# Patient Record
Sex: Female | Born: 1950 | Race: White | Hispanic: Yes | Smoking: Former smoker
Health system: Southern US, Community
[De-identification: ages and names within clinical notes are randomized; demographics above are authoritative.]

## PROBLEM LIST (undated history)

## (undated) DIAGNOSIS — K635 Polyp of colon: Secondary | ICD-10-CM

## (undated) DIAGNOSIS — L900 Lichen sclerosus et atrophicus: Secondary | ICD-10-CM

## (undated) DIAGNOSIS — E039 Hypothyroidism, unspecified: Secondary | ICD-10-CM

## (undated) DIAGNOSIS — J45909 Unspecified asthma, uncomplicated: Secondary | ICD-10-CM

## (undated) DIAGNOSIS — Z780 Asymptomatic menopausal state: Secondary | ICD-10-CM

## (undated) DIAGNOSIS — I2510 Atherosclerotic heart disease of native coronary artery without angina pectoris: Secondary | ICD-10-CM

## (undated) DIAGNOSIS — I10 Essential (primary) hypertension: Secondary | ICD-10-CM

## (undated) DIAGNOSIS — R0600 Dyspnea, unspecified: Secondary | ICD-10-CM

## (undated) DIAGNOSIS — D649 Anemia, unspecified: Secondary | ICD-10-CM

## (undated) HISTORY — DX: Lichen sclerosus et atrophicus: L90.0

## (undated) HISTORY — DX: Unspecified asthma, uncomplicated: J45.909

## (undated) HISTORY — DX: Anemia, unspecified: D64.9

## (undated) HISTORY — PX: SHOULDER SURGERY: SHX246

## (undated) HISTORY — PX: CARPAL TUNNEL RELEASE: SHX101

## (undated) HISTORY — DX: Polyp of colon: K63.5

## (undated) HISTORY — DX: Essential (primary) hypertension: I10

## (undated) HISTORY — DX: Hypothyroidism, unspecified: E03.9

## (undated) HISTORY — DX: Asymptomatic menopausal state: Z78.0

---

## 2001-05-05 ENCOUNTER — Other Ambulatory Visit: Admission: RE | Admit: 2001-05-05 | Discharge: 2001-05-05 | Payer: Self-pay | Admitting: Gynecology

## 2001-10-25 ENCOUNTER — Encounter: Admission: RE | Admit: 2001-10-25 | Discharge: 2001-11-23 | Payer: Self-pay | Admitting: *Deleted

## 2001-11-15 HISTORY — PX: BREAST SURGERY: SHX581

## 2002-05-10 ENCOUNTER — Other Ambulatory Visit: Admission: RE | Admit: 2002-05-10 | Discharge: 2002-05-10 | Payer: Self-pay | Admitting: Gynecology

## 2002-09-20 ENCOUNTER — Encounter: Admission: RE | Admit: 2002-09-20 | Discharge: 2002-09-20 | Payer: Self-pay | Admitting: Otolaryngology

## 2002-09-20 ENCOUNTER — Encounter: Payer: Self-pay | Admitting: Otolaryngology

## 2002-10-16 ENCOUNTER — Ambulatory Visit (HOSPITAL_BASED_OUTPATIENT_CLINIC_OR_DEPARTMENT_OTHER): Admission: RE | Admit: 2002-10-16 | Discharge: 2002-10-16 | Payer: Self-pay | Admitting: Otolaryngology

## 2002-10-16 ENCOUNTER — Encounter (INDEPENDENT_AMBULATORY_CARE_PROVIDER_SITE_OTHER): Payer: Self-pay | Admitting: *Deleted

## 2003-01-21 ENCOUNTER — Ambulatory Visit (HOSPITAL_BASED_OUTPATIENT_CLINIC_OR_DEPARTMENT_OTHER): Admission: RE | Admit: 2003-01-21 | Discharge: 2003-01-21 | Payer: Self-pay | Admitting: Otolaryngology

## 2003-04-08 ENCOUNTER — Encounter (INDEPENDENT_AMBULATORY_CARE_PROVIDER_SITE_OTHER): Payer: Self-pay | Admitting: *Deleted

## 2003-04-08 ENCOUNTER — Ambulatory Visit (HOSPITAL_COMMUNITY): Admission: RE | Admit: 2003-04-08 | Discharge: 2003-04-08 | Payer: Self-pay | Admitting: Gastroenterology

## 2003-05-13 ENCOUNTER — Ambulatory Visit (HOSPITAL_COMMUNITY): Admission: RE | Admit: 2003-05-13 | Discharge: 2003-05-13 | Payer: Self-pay | Admitting: Gastroenterology

## 2003-05-13 ENCOUNTER — Encounter (INDEPENDENT_AMBULATORY_CARE_PROVIDER_SITE_OTHER): Payer: Self-pay | Admitting: *Deleted

## 2004-04-17 ENCOUNTER — Encounter: Admission: RE | Admit: 2004-04-17 | Discharge: 2004-04-17 | Payer: Self-pay | Admitting: Family Medicine

## 2004-09-23 ENCOUNTER — Encounter: Admission: RE | Admit: 2004-09-23 | Discharge: 2004-09-23 | Payer: Self-pay | Admitting: Family Medicine

## 2004-11-27 ENCOUNTER — Encounter: Admission: RE | Admit: 2004-11-27 | Discharge: 2004-11-27 | Payer: Self-pay | Admitting: Family Medicine

## 2004-12-24 ENCOUNTER — Other Ambulatory Visit: Admission: RE | Admit: 2004-12-24 | Discharge: 2004-12-24 | Payer: Self-pay | Admitting: Gynecology

## 2005-03-15 ENCOUNTER — Encounter: Admission: RE | Admit: 2005-03-15 | Discharge: 2005-03-15 | Payer: Self-pay | Admitting: Family Medicine

## 2006-01-10 ENCOUNTER — Other Ambulatory Visit: Admission: RE | Admit: 2006-01-10 | Discharge: 2006-01-10 | Payer: Self-pay | Admitting: Gynecology

## 2006-04-15 DIAGNOSIS — K635 Polyp of colon: Secondary | ICD-10-CM

## 2006-04-15 HISTORY — DX: Polyp of colon: K63.5

## 2006-10-20 ENCOUNTER — Encounter: Admission: RE | Admit: 2006-10-20 | Discharge: 2006-10-20 | Payer: Self-pay | Admitting: Family Medicine

## 2006-10-20 ENCOUNTER — Ambulatory Visit: Payer: Self-pay | Admitting: Family Medicine

## 2006-12-01 ENCOUNTER — Encounter: Admission: RE | Admit: 2006-12-01 | Discharge: 2006-12-01 | Payer: Self-pay | Admitting: Family Medicine

## 2006-12-15 ENCOUNTER — Ambulatory Visit: Payer: Self-pay | Admitting: Family Medicine

## 2007-02-01 ENCOUNTER — Other Ambulatory Visit: Admission: RE | Admit: 2007-02-01 | Discharge: 2007-02-01 | Payer: Self-pay | Admitting: Gynecology

## 2007-06-27 ENCOUNTER — Encounter: Admission: RE | Admit: 2007-06-27 | Discharge: 2007-06-27 | Payer: Self-pay | Admitting: Gynecology

## 2007-08-30 ENCOUNTER — Encounter: Payer: Self-pay | Admitting: Family Medicine

## 2007-08-30 LAB — CONVERTED CEMR LAB: Pap Smear: NORMAL

## 2007-11-24 ENCOUNTER — Telehealth (INDEPENDENT_AMBULATORY_CARE_PROVIDER_SITE_OTHER): Payer: Self-pay | Admitting: *Deleted

## 2007-11-24 ENCOUNTER — Encounter: Admission: RE | Admit: 2007-11-24 | Discharge: 2007-11-24 | Payer: Self-pay | Admitting: Family Medicine

## 2007-11-24 ENCOUNTER — Ambulatory Visit: Payer: Self-pay | Admitting: Family Medicine

## 2007-11-24 DIAGNOSIS — N951 Menopausal and female climacteric states: Secondary | ICD-10-CM

## 2007-11-24 DIAGNOSIS — M810 Age-related osteoporosis without current pathological fracture: Secondary | ICD-10-CM | POA: Insufficient documentation

## 2007-11-24 DIAGNOSIS — J45909 Unspecified asthma, uncomplicated: Secondary | ICD-10-CM | POA: Insufficient documentation

## 2007-11-24 DIAGNOSIS — E039 Hypothyroidism, unspecified: Secondary | ICD-10-CM | POA: Insufficient documentation

## 2007-11-27 ENCOUNTER — Telehealth (INDEPENDENT_AMBULATORY_CARE_PROVIDER_SITE_OTHER): Payer: Self-pay | Admitting: *Deleted

## 2007-11-30 ENCOUNTER — Ambulatory Visit: Payer: Self-pay | Admitting: Family Medicine

## 2007-12-01 ENCOUNTER — Ambulatory Visit: Payer: Self-pay

## 2007-12-01 ENCOUNTER — Encounter (INDEPENDENT_AMBULATORY_CARE_PROVIDER_SITE_OTHER): Payer: Self-pay | Admitting: Family Medicine

## 2007-12-01 ENCOUNTER — Ambulatory Visit: Payer: Self-pay | Admitting: Cardiology

## 2007-12-04 ENCOUNTER — Telehealth (INDEPENDENT_AMBULATORY_CARE_PROVIDER_SITE_OTHER): Payer: Self-pay | Admitting: *Deleted

## 2007-12-06 ENCOUNTER — Ambulatory Visit: Payer: Self-pay | Admitting: Family Medicine

## 2007-12-06 ENCOUNTER — Encounter (INDEPENDENT_AMBULATORY_CARE_PROVIDER_SITE_OTHER): Payer: Self-pay | Admitting: Family Medicine

## 2007-12-06 DIAGNOSIS — I253 Aneurysm of heart: Secondary | ICD-10-CM | POA: Insufficient documentation

## 2007-12-07 ENCOUNTER — Telehealth (INDEPENDENT_AMBULATORY_CARE_PROVIDER_SITE_OTHER): Payer: Self-pay | Admitting: Family Medicine

## 2007-12-15 ENCOUNTER — Encounter: Payer: Self-pay | Admitting: Internal Medicine

## 2007-12-15 ENCOUNTER — Ambulatory Visit: Payer: Self-pay | Admitting: Thoracic Surgery (Cardiothoracic Vascular Surgery)

## 2007-12-15 ENCOUNTER — Encounter (INDEPENDENT_AMBULATORY_CARE_PROVIDER_SITE_OTHER): Payer: Self-pay | Admitting: Family Medicine

## 2007-12-19 ENCOUNTER — Ambulatory Visit: Payer: Self-pay | Admitting: Cardiology

## 2007-12-19 ENCOUNTER — Encounter: Payer: Self-pay | Admitting: Physician Assistant

## 2007-12-27 ENCOUNTER — Ambulatory Visit: Payer: Self-pay

## 2007-12-27 ENCOUNTER — Encounter (INDEPENDENT_AMBULATORY_CARE_PROVIDER_SITE_OTHER): Payer: Self-pay | Admitting: Family Medicine

## 2008-01-08 ENCOUNTER — Encounter: Admission: RE | Admit: 2008-01-08 | Discharge: 2008-01-08 | Payer: Self-pay | Admitting: Family Medicine

## 2008-01-11 ENCOUNTER — Ambulatory Visit: Payer: Self-pay | Admitting: Cardiovascular Disease

## 2008-01-24 ENCOUNTER — Encounter (INDEPENDENT_AMBULATORY_CARE_PROVIDER_SITE_OTHER): Payer: Self-pay | Admitting: Family Medicine

## 2008-01-29 ENCOUNTER — Ambulatory Visit: Payer: Self-pay | Admitting: Family Medicine

## 2008-04-12 ENCOUNTER — Telehealth (INDEPENDENT_AMBULATORY_CARE_PROVIDER_SITE_OTHER): Payer: Self-pay | Admitting: *Deleted

## 2008-05-20 ENCOUNTER — Ambulatory Visit: Payer: Self-pay | Admitting: Internal Medicine

## 2008-05-20 DIAGNOSIS — I2510 Atherosclerotic heart disease of native coronary artery without angina pectoris: Secondary | ICD-10-CM | POA: Insufficient documentation

## 2008-05-22 ENCOUNTER — Telehealth: Payer: Self-pay | Admitting: Internal Medicine

## 2008-08-19 ENCOUNTER — Encounter: Payer: Self-pay | Admitting: Family Medicine

## 2008-08-19 ENCOUNTER — Ambulatory Visit: Payer: Self-pay | Admitting: Family Medicine

## 2008-08-19 DIAGNOSIS — K3189 Other diseases of stomach and duodenum: Secondary | ICD-10-CM

## 2008-08-19 DIAGNOSIS — R1013 Epigastric pain: Secondary | ICD-10-CM

## 2008-08-19 LAB — CONVERTED CEMR LAB
Bilirubin Urine: NEGATIVE
Glucose, Urine, Semiquant: NEGATIVE
Ketones, urine, test strip: NEGATIVE
Specific Gravity, Urine: <1.005
Urobilinogen, UA: NEGATIVE
pH: 6.5

## 2008-08-29 ENCOUNTER — Encounter (INDEPENDENT_AMBULATORY_CARE_PROVIDER_SITE_OTHER): Payer: Self-pay | Admitting: *Deleted

## 2008-08-30 ENCOUNTER — Ambulatory Visit: Payer: Self-pay | Admitting: Family Medicine

## 2008-08-30 DIAGNOSIS — K921 Melena: Secondary | ICD-10-CM | POA: Insufficient documentation

## 2008-08-30 LAB — CONVERTED CEMR LAB: OCCULT 1: POSITIVE

## 2008-09-27 ENCOUNTER — Telehealth (INDEPENDENT_AMBULATORY_CARE_PROVIDER_SITE_OTHER): Payer: Self-pay | Admitting: *Deleted

## 2008-10-07 ENCOUNTER — Ambulatory Visit: Payer: Self-pay | Admitting: Family Medicine

## 2008-10-07 DIAGNOSIS — I10 Essential (primary) hypertension: Secondary | ICD-10-CM | POA: Insufficient documentation

## 2008-11-22 ENCOUNTER — Ambulatory Visit: Payer: Self-pay | Admitting: Family Medicine

## 2008-11-25 ENCOUNTER — Ambulatory Visit: Payer: Self-pay | Admitting: Family Medicine

## 2008-11-26 ENCOUNTER — Encounter: Payer: Self-pay | Admitting: Family Medicine

## 2008-12-09 ENCOUNTER — Encounter (INDEPENDENT_AMBULATORY_CARE_PROVIDER_SITE_OTHER): Payer: Self-pay | Admitting: *Deleted

## 2009-01-28 ENCOUNTER — Ambulatory Visit: Payer: Self-pay | Admitting: Gynecology

## 2009-01-28 ENCOUNTER — Other Ambulatory Visit: Admission: RE | Admit: 2009-01-28 | Discharge: 2009-01-28 | Payer: Self-pay | Admitting: Gynecology

## 2009-01-28 ENCOUNTER — Encounter: Payer: Self-pay | Admitting: Gynecology

## 2009-02-13 ENCOUNTER — Encounter: Admission: RE | Admit: 2009-02-13 | Discharge: 2009-02-13 | Payer: Self-pay | Admitting: Gynecology

## 2009-02-17 ENCOUNTER — Ambulatory Visit: Payer: Self-pay | Admitting: Gynecology

## 2009-04-08 ENCOUNTER — Ambulatory Visit: Payer: Self-pay | Admitting: Gynecology

## 2009-12-05 ENCOUNTER — Emergency Department (HOSPITAL_BASED_OUTPATIENT_CLINIC_OR_DEPARTMENT_OTHER): Admission: EM | Admit: 2009-12-05 | Discharge: 2009-12-05 | Payer: Self-pay | Admitting: Emergency Medicine

## 2009-12-05 ENCOUNTER — Ambulatory Visit: Payer: Self-pay | Admitting: Diagnostic Radiology

## 2009-12-09 ENCOUNTER — Telehealth: Payer: Self-pay | Admitting: Family Medicine

## 2010-01-29 ENCOUNTER — Ambulatory Visit: Payer: Self-pay | Admitting: Gynecology

## 2010-03-15 HISTORY — PX: COLONOSCOPY W/ BIOPSIES: SHX1374

## 2010-12-15 NOTE — Progress Notes (Signed)
Summary: refill  Phone Note Refill Request Message from:  Fax from Pharmacy on [address] fax [phone]  levothyroxine  Initial call taken by: Barb Merino,  December 09, 2009 3:57 PM  Follow-up for Phone Call        Pt has not been in since 11/2008. Army Fossa CMA  December 09, 2009 4:12 PM   Additional Follow-up for Phone Call Additional follow up Details #1::        pt needs ov---fill until appoint Additional Follow-up by: Loreen Freud DO,  December 09, 2009 5:06 PM    Additional Follow-up for Phone Call Additional follow up Details #2::    Pt states she is not longer going here- she is seeing a dr who speaks spanish. unsure of name. Army Fossa CMA  December 10, 2009 8:31 AM

## 2011-03-30 NOTE — Assessment & Plan Note (Signed)
[facility]                            CARDIOLOGY OFFICE NOTE   Felicia [name], Felicia [name]                        MRN:          [id number]  DATE:12/19/2007                            DOB:          [birth year]    REFERRING PHYSICIAN:  Salvatore Decent. Dorris Fetch, M.D.   CHIEF COMPLAINT:  Left arm pain.   HISTORY OF PRESENT ILLNESS:  Felicia [name] is a 60 year old with his who  has been recently referred by Dr. Dorris Fetch for suspected pulmonary  nodule on chest x-ray.  A CT scan was done.  There were no masses or  nodules noted.  Felicia coronary calcification was noted.  Felicia [name] had an  echocardiogram which demonstrated preserved left ventricular function  but an atrial septal aneurysm.  In careful questioning, Felicia [name] has some  discomfort that extends from Felicia [name] elbow down into Felicia [name] hand.  It is  intermittent but not clearly related to exertion or activity.  Felicia [name]  denies specific chest pain or other major symptoms.  Felicia [name] has had some  shortness of breath, and at times has had wheezing for which Felicia [name] took an  inhaler previously.  Nonetheless, Felicia [name] has not had problems in Felicia  interim.  CT scan demonstrated coronary calcification and as such, given  Felicia [name] symptoms, Felicia [name] was referred for further evaluation by Dr.  Dorris Fetch.  One additional note was Felicia finding of an atrial septal  aneurysm by echocardiography.  Felicia echocardiogram was read by Dr. Olga Millers.  Ejection fraction is normal with an ejection fraction of 55-  65% with no wall motion abnormalities.  Felicia aortic leaflets were  trileaflet with normal aortic valve structure and function.  Mitral  valve was intact.  Felicia left atrium was normal.  There was an atrial  septal aneurysm.  Pulmonic valve and tricuspid valve were normal as  well.  There is no pericardial infusion.  Felicia inferior vena cava was  normal and right atrium was normal.   There are no specific alleviating factors.  There is no obvious specific  radiation.   PAST MEDICAL HISTORY:  Felicia [name] has had tonsillectomy, C-section 21  years ago, a nose procedure 6 years ago, and right breast biopsy 10  years ago.  Felicia [name] has had asthma at times.  Felicia [name] has had occasional  constipation and Felicia [name] has known thyroid disease.   Allergies include IODINE.   FAMILY HISTORY:  Felicia [name] mother is alive at 2.  Felicia [name] father died an accident  at 80.  Felicia [name] has a sister who has osteoporosis and has had a gallbladder  resection.   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  Felicia [name]  drinks about two caffeinated beverages a day.  Felicia [name] is single.  Felicia [name]  occasionally smokes about four cigarettes a day.  Felicia [name] will have one  alcoholic beverage daily.   REVIEW OF SYSTEMS:  Is remarkable for Felicia history of asthma, bleeding  problems, constipation and thyroid disease.  It is otherwise  unremarkable and provided by Felicia [name] interpreter.   PHYSICAL EXAMINATION:  Felicia [name] is alert and oriented and no  acute distress.  Felicia [name] is well-appearing.  Felicia [name] blood pressure is 140/80 measured initially and a pulse is 84.  Rechecked by me, Felicia blood pressure is 150/90.  Felicia skin was cool and dry.  Felicia extraocular my pulse were intact.  Pupils were equal, reactive to  light accommodation.  There were no carotid bruits.  Felicia jugular veins were not distended.  Felicia [name] torso was symmetric. Lung fields were clear to auscultation and  percussion.  Felicia [name] PMI was nondisplaced.  There is a normal first heart sound and  normal second heart sound without murmur or rub or gallop.  Abdomen was soft and I can appreciate no bruits.  Femoral pulses were  intact  equal and bilateral.  Popliteal and Felicia peripheral vessel peripheral pulses were intact.  Examination of Felicia upper extremities did reveal a equal pulses  bilaterally with no obvious bruits.  Notably, Felicia [name] has equal  strength bilaterally, with no evidence of obvious deficit and no sensory  deficit localized.   Electrocardiogram in Felicia office today  demonstrates normal sinus rhythm.  There was a delay in R wave progression with a leftward oriented axis,  otherwise unremarkable tracing.  Left atrial enlargement cannot be  excluded.  See echocardiogram.  Felicia echocardiogram report is in Felicia   chart.   CT scan note is available.  Felicia CT scan suggests coronary calcification.  No nodules on Felicia lung were noted, there is a borderline heart size.   IMPRESSION:  1. Left arm discomfort, not clearly exertion related with a but no      specific findings of etiology.  2. History of coronary calcification by CT scanning.  3. History of mild tobacco use.   RECOMMENDATIONS:  1. Given Felicia [name] left arm discomfort, and coronary calcification, we have      recommended a stress radionuclide imaging study to better evaluate      Felicia symptoms.  2. We would like to obtain  cholesterol profiles from Dr. Laqueta Linden      office.  3. Return to clinic in 2 weeks to review Felicia results.     Arturo Morton. Riley Kill, MD, [facility]  Electronically Signed    TDS/MedQ  DD: 12/19/2007  DT: 12/19/2007  Job #: [id number]   cc:   Leanne Chang, M.D.  Salvatore Decent Dorris Fetch, M.D.

## 2011-03-30 NOTE — Assessment & Plan Note (Signed)
[facility]                            CARDIOLOGY OFFICE NOTE   DAVIELLE, LINGELBACH                        MRN:          [id number]  DATE:01/11/2008                            DOB:          05/16/1951    Edina Winningham was seen in follow-up at the [facility] cardiology office on  January 11, 2008.  Ms. Lie is a 60 year old woman who was evaluated  by Dr. Riley Kill on February 3 in the office.  She was seen because of  coronary calcification noted on a CT scan.  Upon questioning, she  admitted to left arm discomfort from the elbow down to the hand that  occurred intermittently and was at times related to exertion.  She  underwent a exercise Myoview study on February 11.  She was able to  exercise for 6 minutes and 31 seconds, stopping due to fatigue.  She had  no significant EKG changes.  There was a hypertensive blood pressure  response to exercise.  Her nuclear images were normal with normal  perfusion and a gated LVEF of 68%.   She has no new symptoms at this time.  She specifically denies chest  pain or pressure.   CURRENT MEDICATIONS:  Synthroid 125 mcg daily, Fosamax 70 mg weekly,  aspirin 81 mg daily, flax seed oil daily, vitamin D daily and vitamin B  daily.   ALLERGIES:  IODINE   On exam she is alert and oriented.  She is in no acute distress.  Weight  162, blood pressure 144/80, heart rate 84, respiratory rate 16.  HEENT:  Normal.  NECK:  Normal carotid upstrokes without bruits.  Jugular venous pressure is normal.  LUNGS:  Clear bilaterally.  HEART:  Regular rate and rhythm without murmurs or gallops.  ABDOMEN:  Soft, nontender, no organomegaly.  No bruits.  EXTREMITIES:  No clubbing, cyanosis or edema.  Peripheral pulses are 2+  and equal throughout.   ASSESSMENT:  Ms. Wurtz is a 60 year old woman with coronary  calcification identified on a chest CT scan.  Her nuclear stress study  is reassuring.  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  I have advised Ms.  Ivanov  to continue with lifelong aspirin 81 mg daily.  Her cholesterol  panel should be reassessed in light of her nonobstructive CAD, with a  goal LDL less than 100.  I would have a low threshold to start her on a  statin.  I will defer to Dr. Blossom Hoops in this regard.  I think she can  be seen back on an as-needed basis if she develops any cardiac problems,  but at this point the main issue is going to be risk reduction measures.     Veverly Fells. Excell Seltzer, MD  Electronically Signed    MDC/MedQ  DD: 01/11/2008  DT: 01/12/2008  Job #: [id number]   cc:   Arturo Morton. Riley Kill, MD, [facility]  Leanne Chang, M.D.

## 2011-03-30 NOTE — Consult Note (Signed)
NEW PATIENT CONSULTATION   Felicia [name], Felicia [name]  DOB:  Mar 31, 1951                                        December 15, 2007  CHART #:  [id number]   REASON FOR CONSULTATION:  Atrial septal aneurysm and coronary artery  calcification.   HISTORY OF PRESENT ILLNESS:  The patient is a 60 year old Hispanic  female who does have a past history of asthma.  Recently she was  cleaning in her bedroom with some type of spray solution and she got  irritated and had problems with shortness of breath and wheezing.  She  went to see Dr. Blossom Hoops and a chest x-ray was performed.  This  suggested a possible pulmonary nodule.  A chest CT was then done which  showed some coronary artery calcification.  There was no adenopathy and  no pulmonary nodule noted.  She did have some streaky scarring and  atelectasis in her bases.  She also had an echocardiogram done which  showed normal left ventricular function but there was noted to be an  atrial septal aneurysm but there was trivial mitral regurgitation.  The  patient states that since she has had these tests done that her  breathing had improved over the past couple of weeks.  She is still  using her inhaler intermittently but less frequently.  She denies any  chest pressure tightness.  She does say she has occasional indigestion  and also complains of occasional left arm numbness and tingling.  It is  difficult even through an interpreter whether this is truly exertional  but it does sound like it most often has happened during activity.  She  does not correlate her wheezing with her left arm discomfort.   PAST MEDICAL HISTORY:  Her past medical history is significant for  asthma, osteoporosis and hypothyroidism.  She has no prior history of  cardiac disease.   CURRENT MEDICATIONS:  1. Synthroid 125 mcg daily.  2. Fosamax 70 mg weekly.  3. Asthma Mist over-the-counter.  4. Prednisone 20 mg 3 tablets p.o. daily.   ALLERGIES:  She does  have an allergy to contrast dye.   FAMILY HISTORY:  Negative for premature coronary disease.  There is a  history of hypertension in her family.   SOCIAL HISTORY:  She is single.  She has 1 child.  She smokes rarely,  about 4 cigarettes a day.  She has 1 drink daily.   REVIEW OF SYSTEMS:  Asthma with occasional wheezing.  She said she has  had 1 episode of shortness of breath while lying flat.  She does have  some reflux discomfort, occasional constipation and occasional muscle  pain.  All other systems are negative.   PHYSICAL EXAMINATION:  General:  On physical examination the patient is  a 60 year old Hispanic female in no acute distress.  Vital signs:  Blood  pressure is 134/88, pulse 88, respirations 18, oxygen saturation is 97%  on room air.  Neurological:  She is alert and oriented x3, appropriate,  grossly intact.  No focal deficits.  HEENT:  Exam is unremarkable.  Neck:  Supple without thyromegaly, adenopathy or bruits.  Cardiovascular:  Regular rate and rhythm.  Normal S1, S2, no murmurs,  rubs or gallops.  Lungs:  Clear with equal breath sounds.  No wheezing  is present currently.  Abdomen:  Soft, nontender.  Extremities:  Without  clubbing, cyanosis or edema.  She has 2+ pulses throughout.  Skin:  Warm  and dry.   CT scan and echocardiogram were reviewed.   IMPRESSION:  1. The patient is a 60 year old Hispanic female who has recently been      discovered to have an atrial septal aneurysm and coronary artery      calcification.  I discussed in detail with the patient the      implications of these findings.  First regarding the atrial septal      aneurysm this really is not of significant concern.  There have      been some reports although not definitive that there is potential      for increased risk of cardioembolic phenomenon.  The only treatment      that I would recommend regarding that would be to put her on an      aspirin a day.  I have also pertaining to her  coronary artery      disease no further specific followup is needed for the atrial      septal aneurysm.  I did reassure her that this is not a typical      arterial aneurysm that would be of greater concern.  2. Secondly, she does have coronary artery calcification and although      it is difficult to do a precise history there may be some      exertional component.  She also has some reflux symptoms, all of      which given that she has coronary calcifications on CT scan are      concerning that it could be potential anginal symptoms.  I think it      would be in her interest given the CT findings to see a      cardiologist to see if either a stress test or catheterization      would be appropriate.  I discussed this with Dr. Shawnie Pons and      he is going to get her set up to see one of the [facility]      cardiologist in the next few days.   I would be happy to see the patient back in the future if I could be of  any further assistance with her care.   Salvatore Decent Dorris Fetch, M.D.  Electronically Signed   SCH/MEDQ  D:  12/15/2007  T:  12/16/2007  Job:  [id number]   cc:   Leanne Chang, M.D.  [facility]

## 2011-03-30 NOTE — Letter (Signed)
December 19, 2007    Leanne Chang, M.D.  [address]  [place], [place]  [address]   RE:  [name], [name]  MRN:  [id number]  /  DOB:  July 24, 1951   Dear Dr. Blossom Hoops:   It was a pleasure to see your patient, Felicia [name], in consultation at  the request of Dr. Andrey Spearman.  As Brett Canales alluded to, she has  coronary calcification by CT study.  Her chief complaint is that of left  arm discomfort.  It is not clear that it is exertion related, and yet  she has no obvious physical findings of either neurologic deficit nor  vascular obstruction in the affected extremity.  She does have a job  that requires her to be active.  She denies exertional chest tightness.  In that light, we have recommended that she have a stress nuclear study  to exclude underlying significant coronary ischemia based on her CT  study that has been previously done.   An incidental note has been an atrial septal aneurysm.  I concur with  Dr. Sunday Corn recommendation that she take an aspirin a day.  We  will see her back in two weeks to review the results of her findings,  and we appreciate the opportunity of sharing in her care.    Sincerely,      Arturo Morton. Riley Kill, MD, [facility]  Electronically Signed    TDS/MedQ  DD: 12/19/2007  DT: 12/19/2007  Job #: [id number]

## 2011-03-30 NOTE — Letter (Signed)
December 15, 2007   Leanne Chang, M.D.  [phone]  [place], [place]  [address]    RE:  Felicia [name]   Re:  Felicia [name], Felicia [name]                 DOB:  [birth year]   Dear Jonetta Speak:   I saw Karsten Ro interview he office today.  Thank you very much for  allowing me to see this very nice lady.  As you know she recently was  discovered to have an atrial septal aneurysm as well as some coronary  artery calcification.  Atrial septal aneurysm was noted on echo and the  coronary calcification noted on CT.  Regarding the atrial septal  aneurysm I recommended that she take an aspirin a day for that.  There  is no surgical intervention necessary or is any specific follow-up  needed.  More concern is the coronary artery calcification.  I spoke  with Bonnee Quin and he is going to have one of the [facility]  cardiologists evaluate her.  She does have some vague symptoms that  could be anginal although it is difficult to pin down precisely.  They  are going to see her next week and see if any further work needs to be  done regarding the coronary calcification seen on CT such as a stress  test or catheterization.  I would more than happy to see Mr. Welborn back  at any time in the future if I could be of any further assistance for  the care.   Sincerely,   Salvatore Decent. Dorris Fetch, M.D.  Electronically Signed   SCH/MEDQ  D:  12/15/2007  T:  12/16/2007  Job:  [id number]

## 2011-04-02 NOTE — Op Note (Signed)
NAME:  [name], Felicia [name]                           ACCOUNT NO.:  [id number]   MEDICAL RECORD NO.:  [id number]                   PATIENT TYPE:  AMB   LOCATION:  ENDO                                 FACILITY:  [facility]   PHYSICIAN:  Anselmo Rod, M.D.               DATE OF BIRTH:  09/08/1951   DATE OF PROCEDURE:  04/08/2003  DATE OF DISCHARGE:                                 OPERATIVE REPORT   PROCEDURE:  1. Colonoscopy with snare polypectomy x1.  2. Biopsy x 1.   ENDOSCOPIST:  Anselmo Rod, M.D.   INSTRUMENT USED:  Olympus video colonoscope.   INDICATIONS FOR PROCEDURE:  This 60 year old Hispanic female with [name] change  in bowel habits and pencil-thin stools.  Rule out colonic polyps, masses,  etc.   PRE-PROCEDURE PREPARATION:  An informed consent was procured from the  patient.  The patient was fasted for eight hours prior to the procedure and  prepped with [name] bottle of magnesium citrate and [name] gallon of GoLYTELY on the  night prior to the procedure.   PRE-PROCEDURE PREPARATION:  VITAL SIGNS:  Stable.  NECK:  Supple.  CHEST:  Clear to auscultation.  HEART:  S1, S2 regular.  ABDOMEN:  Soft with normal bowel sounds.   DESCRIPTION OF PROCEDURE:  The patient was placed in the left lateral  decubitus position and sedated with 60 mg of Demerol and 6 mg of Versed  intravenously.  Once the patient was adequately sedated and maintained on  low-flow oxygen, and continuous cardiac monitoring, the Olympus video  colonoscope was advanced from the rectum to the cecum and the terminal ileum  without difficulty.  [name] 4 to 5 mm flat polyp was snared from the distal right  colon.  Another polyp was biopsied from 70 cm.  No other abnormalities were  noted throughout the colon, including the terminal ileum.  The appendicular  orifice and the ileocecal valve were clearly visualized and photographed.  Small internal hemorrhoids were seen on retroflexion into the rectum.  The patient tolerated the  procedure well without complications.   IMPRESSION:  1. Two polyps removed from the colon (see description above).  2. Small non-bleeding internal hemorrhoids.  3. No evidence of diverticulosis.   RECOMMENDATIONS:  1. Await pathology results.  2.     Avoid all non-steroidals, including aspirin, for the next four weeks.  3. Outpatient followup in the next two weeks for further recommendations.  4. Continue on [name] high-fiber diet with liberal fluid intake as advised.                                                 Anselmo Rod, M.D.    JNM/MEDQ  D:  04/08/2003  T:  04/08/2003  Job:  [id number]   cc:   Larae Grooms, M.D.  [place], [place]

## 2011-04-02 NOTE — Op Note (Signed)
   NAME:  Felicia [name], Felicia [name]                           ACCOUNT NO.:  [id number]   MEDICAL RECORD NO.:  [id number]                   PATIENT TYPE:  AMB   LOCATION:  ENDO                                 FACILITY:  [facility]   PHYSICIAN:  Anselmo Rod, M.D.               DATE OF BIRTH:  09-24-1951   DATE OF PROCEDURE:  05/13/2003  DATE OF DISCHARGE:                                 OPERATIVE REPORT   PROCEDURE:  Esophagogastroduodenoscopy with biopsies.   ENDOSCOPIST:  Anselmo Rod, M.D.   INSTRUMENT USED:  Olympus video panendoscope.   INDICATIONS FOR PROCEDURE:  A 60 year old female with a history of abdominal  pain and bloating.  Family history of stomach cancer in a paternal  grandmother, rule out peptic ulcer disease, esophagitis, gastritis, etc.   PREPROCEDURE PREPARATION:  Informed consent was procured from the patient.  The patient was fasted for eight hours prior to the procedure.   PREPROCEDURE PHYSICAL EXAMINATION:  VITAL SIGNS: Stable.  NECK:  Supple.  CHEST:  Clear to auscultation. S1 and S2 regular.  ABDOMEN:  Soft with normal bowel sounds.   DESCRIPTION OF PROCEDURE:  The patient was placed in the left lateral  decubitus position and sedated with 60 mg of Demerol and 6 mg of Versed  intravenously. Once the patient was adequately sedated and maintained on low  flow oxygen, and continuous cardiac monitoring, the Olympus video  panendoscope was advanced through the mouthpiece, over the tongue, and into  the esophagus under direct vision. There was a small patch of Barrett's type  mucosa above the Z-line, biopsied for pathology. A small hiatal hernia was  seen on high retroflexion. The rest of the gastric mucosa and the proximal  small bowel appeared normal.   IMPRESSION:  1. Small hiatal hernia seen on high retroflexion.  2. Patch of Barrett's-like mucosa biopsied above the Z-line to rule out     neoplasia and to confirm the diagnosis of Barrett's.    RECOMMENDATIONS:   Await pathology results.  Outpatient follow-up within the  next two weeks for further recommendations.                                               Anselmo Rod, M.D.    JNM/MEDQ  D:  05/13/2003  T:  05/13/2003  Job:  [id number]   cc:   Gabriel Earing, M.D.  [address]  [place]  [place] [address]  Fax: [phone]

## 2011-04-02 NOTE — Op Note (Signed)
NAME:  TOI, STELLY                           ACCOUNT NO.:  [id number]   MEDICAL RECORD NO.:  [id number]                   PATIENT TYPE:  AMB   LOCATION:  DSC                                  FACILITY:  [facility]   PHYSICIAN:  Christopher E. Ezzard Standing, M.D.         DATE OF BIRTH:  Oct 16, 1951   DATE OF PROCEDURE:  10/16/2002  DATE OF DISCHARGE:                                 OPERATIVE REPORT   PREOPERATIVE DIAGNOSES:  Bilateral sinonasal polyps with nasal obstruction,  bilateral inferior turbinate hypertrophy.   POSTOPERATIVE DIAGNOSES:  Bilateral sinonasal polyps with nasal obstruction,  bilateral inferior turbinate hypertrophy, bilateral ethmoid sinus disease  with polyps, bilateral maxillary sinus disease with polyps.   OPERATION/PROCEDURE:  Functional endoscopic sinus surgery with bilateral  ethmoidectomies with the removal of ethmoid polyps, bilateral maxillary  ostia enlargement with removal of polypoid disease, bilateral submucosal  cauterization, inferior turbinate reductions.   ANESTHESIA:  General endotracheal.   COMPLICATIONS:  None.   BRIEF CLINICAL NOTE:  The patient is a 60 year old female who has had  chronic nasal obstruction and drainage from her nose.  The left side is  worse than the right.  On examination, she had a large left nasal polyp and  smaller right nasal polyps within the middle meatus.  CT scan showed  bilateral ethmoid and maxillary sinus disease and some nasal frontal disease  with relatively sparing of the frontal sinuses and sphenoid sinuses.  She is  taken to the operating room at this time for functional endoscopic sinus  surgery with removal of polypoid disease as well as inferior turbinate  reductions to improve the airway.   DESCRIPTION OF PROCEDURE:  After adequate endotracheal anesthesia, the  patient received 1 g of Ancef IV preoperatively.  The nose was prepped with  cotton pledgets soaked in Afrin and the middle meatus polyp area was  injected with Xylocaine with epinephrine for hemostasis.  First the right  side was approached.  The sickle knife was used to make an incision along  the uncinate process which was removed.  The anterior and posterior ethmoid  area was opened up with the microdebrider.  There was some small polypoid  disease within the anterior and posterior ethmoid region on the right but  this was actually fairly minimal disease.  The maxillary ostia was enlarged  with back biters and straight Tru-cut forceps.  The Afrin pledgets were then  placed for hemostasis.  Next, the left side was approached.  The patient had  a large polyp extruding from the left middle meatus, left superior anterior  ethmoid area.  This was removed and sent as a separate specimen.  There were  several smaller polyps back in the anterior ethmoid and a little bit of  polypoid disease back in the posterior ethmoid region on the left side.  The  microdebrider was used to open up the left ethmoid area.  The microdebrider  was  used to open up the inferior aspect of the left nasal frontal area where  there was some polypoid disease.  Again, the cotton soaked in Afrin was  placed for hemostasis.  The maxillary ostia was identified and was enlarged  with back-biting and inspected with Tru-cut forceps.  There was a little bit  of thickening and polypoid disease within the maxillary sinus.  This  basically completed the procedure.  Following completion of an ethmoidectomy  and maxillary ostial enlargement, inferior turbinate reduction was  performed.  The bipolar cautery was used to perform submucosal cauterization  of the inferior turbinate and the inferior turbinates were injected with  approximately 30 mg of Depo-Medrol on each side.  The inferior turbinates  were out fractured.  This completed the procedure.  The patient was awoken  from anesthesia and transferred to the recovery room, postoperatively doing  well.   DISPOSITION:  The  patient is discharged home later this morning on Tylenol  and Vicodin for pain, Keflex 500 mg b.i.d. for one week.  I will have her  follow up in my office in three days for a recheck and removal of her  Kyung Rudd sinus packs.  Note that the [facility] sinus packs were placed within  the middle meatus following the completion of the procedure and hydrated  with Xylocaine with epinephrine.                                                  Kristine Garbe. Ezzard Standing, M.D.    CEN/MEDQ  D:  10/16/2002  T:  10/16/2002  Job:  [id number]

## 2011-09-21 ENCOUNTER — Encounter: Payer: Self-pay | Admitting: Gynecology
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
## 2012-05-23 DIAGNOSIS — E039 Hypothyroidism, unspecified: Secondary | ICD-10-CM | POA: Insufficient documentation

## 2012-05-25 ENCOUNTER — Encounter: Payer: Self-pay | Admitting: Gynecology

## 2012-07-14 ENCOUNTER — Ambulatory Visit (INDEPENDENT_AMBULATORY_CARE_PROVIDER_SITE_OTHER): Payer: 59 | Admitting: Gynecology

## 2012-07-14 ENCOUNTER — Encounter: Payer: Self-pay | Admitting: Gynecology

## 2012-07-14 VITALS — BP 120/78 | Ht 62.5 in | Wt 152.0 lb

## 2012-07-14 DIAGNOSIS — M8580 Other specified disorders of bone density and structure, unspecified site: Secondary | ICD-10-CM

## 2012-07-14 DIAGNOSIS — Z780 Asymptomatic menopausal state: Secondary | ICD-10-CM

## 2012-07-14 DIAGNOSIS — Z01419 Encounter for gynecological examination (general) (routine) without abnormal findings: Secondary | ICD-10-CM

## 2012-07-14 DIAGNOSIS — M899 Disorder of bone, unspecified: Secondary | ICD-10-CM

## 2012-07-14 DIAGNOSIS — L900 Lichen sclerosus et atrophicus: Secondary | ICD-10-CM

## 2012-07-14 DIAGNOSIS — L940 Localized scleroderma [morphea]: Secondary | ICD-10-CM

## 2012-07-14 MED ORDER — CLOBETASOL PROPIONATE 0.05 % EX CREA: TOPICAL_CREAM | Freq: Two times a day (BID) | CUTANEOUS | Status: AC

## 2012-07-14 NOTE — Progress Notes (Signed)
Felicia [name] Dec 20, 1950 [id number]   History:    61 y.o.  for annual gyn exam who has not been seen the office since 2011. Patient several years ago was diagnosed with lichen sclerosis et atrophicus and had been treated with clobetasol 0.05% which was initially prescribed for her to take twice a day for 6 weeks and then to return to the office for reassessment. She did not return to the office after this treatment was offered. She's now complaining of vulvar pruritus again she had some of the cream and started to apply. Review of patient's records indicated that she had benign colon polyps in 2009 and her last colonoscopy was in 2011. Her mammogram was in June of this year. She has had history of left breast biopsy in 1986 and 2003 which were benign. Her last bone density study was in 2010 she had evidence of decreased bone mineralization (osteopenia) with her lowest T score at the left femoral neck -1.7. Patient had been on Fosamax from 2060 2012 and is currently on a drug-free holiday. Patient's mother and sister both with history of osteoporosis. Patient stop smoking year ago. Dr. Luiz Iron is her primary physician that has been monitoring her hypothyroidism and hypertension.  Past medical history,surgical history, family history and social history were all reviewed and documented in the EPIC chart.  Gynecologic History No LMP recorded. Patient is postmenopausal. Contraception: none Last Pap: 2011. Results were: normal Last mammogram: 2013. Results were: normal  Obstetric History OB History    Grav Para Term Preterm Abortions TAB SAB Ect Mult Living   1 1 1       1      # Outc Date GA Lbr Len/2nd Wgt Sex Del Anes PTL Lv   1 TRM     M CS  No Yes       ROS: A ROS was performed and pertinent positives and negatives are included in the history.  GENERAL: No fevers or chills. HEENT: No change in vision, no earache, sore throat or sinus congestion. NECK: No pain or stiffness. CARDIOVASCULAR: No  chest pain or pressure. No palpitations. PULMONARY: No shortness of breath, cough or wheeze. GASTROINTESTINAL: No abdominal pain, nausea, vomiting or diarrhea, melena or bright red blood per rectum. GENITOURINARY: No urinary frequency, urgency, hesitancy or dysuria. MUSCULOSKELETAL: No joint or muscle pain, no back pain, no recent trauma. DERMATOLOGIC: No rash, no itching, no lesions. ENDOCRINE: No polyuria, polydipsia, no heat or cold intolerance. No recent change in weight. HEMATOLOGICAL: No anemia or easy bruising or bleeding. NEUROLOGIC: No headache, seizures, numbness, tingling or weakness. PSYCHIATRIC: No depression, no loss of interest in normal activity or change in sleep pattern.     Exam: chaperone present  BP 120/78  Ht 5' 2.5" (1.588 m)  Wt 152 lb (68.947 kg)  BMI 27.36 kg/m2  Body mass index is 27.36 kg/(m^2).  General appearance : Well developed well nourished female. No acute distress HEENT: Neck supple, trachea midline, no carotid bruits, no thyroidmegaly Lungs: Clear to auscultation, no rhonchi or wheezes, or rib retractions  Heart: Regular rate and rhythm, no murmurs or gallops Breast:Examined in sitting and supine position were symmetrical in appearance, no palpable masses or tenderness,  no skin retraction, no nipple inversion, no nipple discharge, no skin discoloration, no axillary or supraclavicular lymphadenopathy Abdomen: no palpable masses or tenderness, no rebound or guarding Extremities: no edema or skin discoloration or tenderness  Pelvic: External genital labia majora with a white plaque-like areas with areas of  excoriation from scratching (lichen sclerosis)  Bartholin, Urethra, Skene Glands: Within normal limits             Vagina: No gross lesions or discharge  Cervix: No gross lesions or discharge  Uterus  anteverted, normal size, shape and consistency, non-tender and mobile  Adnexa  Without masses or tenderness  Anus and perineum  normal   Rectovaginal   normal sphincter tone without palpated masses or tenderness             Hemoccult cards provided for her to submit to the office for testing     Assessment/Plan:  61 y.o. female for annual exam with history of lichen sclerosus will be started once again on clobetasol 0.05% that I want her to apply twice a day for one month and then to apply twice a week thereafter. We'll have her followup in the office in 6 months. She will schedule her bone density study. We discussed importance of calcium and vitamin D for osteoporosis prevention as well as weightbearing exercises for 45 minutes 3 or 4 times a week. She was reminded to do her monthly self breast examination. She is to submit the Hemoccult cards to the office for testing. She will followup with Dr.Cabeza  we'll be doing her lab work.    Ok Edwards MD, 5:44 PM 07/14/2012

## 2012-07-14 NOTE — Patient Instructions (Addendum)
Mantenimiento de Engineer, maintenance (IT) las mujeres (Health Maintenance, Females) Un estilo de vida saludable y los cuidados preventivos pueden favorecer la salud y [place].   Haga exmenes regulares de la salud en general, dentales y de los ojos.   Consuma una dieta saludable. Los [employer] Micro[employer], frutas, granos enteros, productos lcteos descremados y protenas magras contienen los nutrientes que usted necesita sin necesidad de consumir muchas caloras. Disminuya el consumo de alimentos con alto contenido de grasas slidas, azcar y sal agregadas. Si es necesario, pdaleinformacin acerca de una dieta [place] a su mdico.   La actividad fsica regular es una de las cosas ms importantes que puede hacer por su salud. Los adultos deben hacer al menos 150 minutos de ejercicios de intensidad moderada (cualquier actividad que aumente la frecuencia cardaca y lo haga transpirar) cada semana. Adems, la [employer]ley[employer]son de los adultos necesita ejercicios de fortalecimiento muscular 2  ms [employer].    Mantenga un peso saludable. El ndice de masa corporal [facility]) es una herramienta que identifica posibles problemas con [place]. Proporciona una estimacin de la grasa corporal basndose en el peso y la altura. El mdico podr determinar su [facility] y podr ayudarlo a Personnel officer o Pharmacologist un peso saludable. Para los adultos de 20 aos o ms:   Un [facility] menor a 18,5 se considera bajo peso.   Un [facility] entre 18,5 y 24,9 es normal.   Un [facility] entre 25 y 29,9 es sobrepeso.   Un [facility] entre 30 o ms es obesidad.   Mantenga un nivel normal de lpidos y colesterol en sangre practicando actividad fsica y minimizando la ingesta de grasas saturadas. Consuma una dieta balanceada e incluya variedad de frutas y vegetales. Los [employer] de lpidos y Oncologist en sangre deben Games developer a los 20 aos y repetirse cada 5 aos. Si los niveles de colesterol son altos, tiene ms de 50 aos o tiene riesgo elevado de sufrir enfermedades  cardacas, Pension scheme manager controlarse con ms frecuencia.Si tiene [employer] de lpidos y colesterol, debe recibir tratamiento con medicamentos, si la dieta y el ejercicio no son efectivos.   Si fuma, consulte con [place] [employer] de las opciones para dejar de [place]. Si no lo hace, no comience.   Si est embarazada no beba alcohol. Si est amamantando, beba alcohol con prudencia. Si elige beber alcohol, no se exceda de 1 medida por da. Se considera una medida a 12 onzas (355 ml) de cerveza, 5 onzas (148 ml) de vino, o 1,5 onzas (44 ml) de licor.   Evite el alcohol y el consumo de drogas. No comparta agujas. Pida ayuda si necesita asistencia o instrucciones con respecto a abandonar el consumo de alcohol, cigarrillos o drogas.   La hipertensin arterial causa enfermedades cardacas y [place] el riesgo de ictus. Debe controlar su presin arterial al menos cada 1 o 2 aos. La presin arterial elevada que persiste debe tratarse con medicamentos si la prdida de peso y el ejercicio no son efectivos.   Si tiene entre 55 y 3 aos, consulte a su mdico si debe tomar aspirina para prevenir enfermedades cardacas.   Los anlisis para la diabetes incluyen la toma de [place] de sangre para controlar el nivel de azcar en la sangre durante el [place]. Debe hacerlo cada 3 aos despus de los 45 aos si est dentro de su peso normal y sin factores de riesgo para la diabetes. Las pruebas deben comenzar a edades tempranas o llevarse a cabo con ms frecuencia  si tiene sobrepeso y al menos 1 factor de riesgo para la diabetes.   Las evaluaciones para Market researcher de mama son un mtodo preventivo fundamental para las mujeres. Debe practicar la "autoconciencia de las mamas". Esto significa que Product/process development scientist apariencia normal de sus mamas y [employer] siente y pudiendo incluir un autoexamen de Building control surveyor. Si detecta algn cambio, no importa cun pequeo sea, debe informarlo a su mdico. Las mujeres entre 20 y  40 aos deben hacer un examen clnico de las mamas como parte del examen regular de [place], cada 1 a 3 aos. Despus de los 40 aos deben Sprint Nextel Corporation. Deben hacerse una mamografa radografa de mamas ) cada ao, comenzando a los 40 aos. Las mujeres con Brewing technologist de cncer de mama deben hablar con el mdico para hacer un estudio gentico. Las que tienen ms riesgo deben Geographical information systems officer resonancia magntica y [employer] todos [place].   Un test de Pap se realiza para diagnosticar cncer de cuello de tero. Las mujeres deben [employer] un test de Pap a partir de los [address]. [employer]ne[employer] 21 y los 29 aos debe repetirse [address]. Luego de los 30 aos, debe realizarse un test de Pap cada tres aos siempre que los 3 estudios anteriores sean normales. Si le han realizado una histerectoma por un problema que no era cncer u otra enfermedad que podra causar cncer, ya no necesitar un test de Pap. Si tiene entre 65 y 67 aos y ha tenido Scientist, research (physical sciences) de Pap normal en los ltimos 10 aos, ya no ser Music therapist. Si ha recibido un tratamiento para Management consultant cervical o para una enfermedad que podra causar cncer, Musician un test de Pap y controles durante al menos 20 aos de concluir el [place]. Si no se ha Patent attorney con regularidad, Writer a evaluarse los factores de riesgo (como el tener un nuevo compaero sexual) para Occupational psychologist a Printmaker. Algunas mujeres sufren problemas mdicos que aumentan la probabilidad de Research officer, political party cervical. En estos casos, el mdico podr indicar que se realice el test de Pap con ms frecuencia.   La prueba del virus del Geneticist, molecular (VPH) es un anlisis adicional que puede usarse para Engineer, site de cuello de tero. Esta prueba busca la presencia del virus que causa los cambios en el cuello. Las clulas que se recolectan durante el test de Pap pueden usarse para el VPH. La prueba para el VPH puede  usarse para Development worker, community a mujeres de ms de 30 aos y debe usarse en mujeres de cualquier edad Cisco del test de Pap no sean claros. Despus de los 30 aos, las mujeres deben hacerse el anlisis para el VPH con la misma frecuencia que el test de Pap.   El cncer colorectal puede detectarse y con frecuencia puede prevenirse. La mayor parte de los estudios de rutina comienzan a los 50 aos y [place] 75 aos. Sin embargo, el mdico podr aconsejarle que lo haga antes, si tiene factores de riesgo para el cncer de colon. Una vez por ao, el profesional le dar un kit de prueba para Scientist, product/process development en la materia fecal. La utilizacin de un tubo con una pequea cmara en su extremo para examinar directamente el colon (sigmoidoscopa o colonoscopa), puede detectar formas temprana de cncer colorectal. Hable con su mdico si tiene 50 aos, cuando comience con los estudios de [place]. El examen directo del  colon debe repetirse cada 5 a 10 aos, hasta los 75 aos, excepto que se encuentren formas tempranas de plipos precancerosos o pequeos bultos.   Se recomienda realizar un anlisis de sangre para Engineer, manufacturing hepatitis C a todas las personas [address] 1947 y 1967, y a todo aquel que tenga un riesgo conocido de haber contrado esta enfermedad.   Practique el sexo seguro. Use condones y evite las prcticas sexuales riesgosas para disminuir el contagio de enfermedades de transmisin sexual. Las mujeres sexualmente activas de 25 aos o menos deben controlarse para descartar clamidia, que es una infeccin de transmisin sexual frecuente. Las Coca [employer]a que tengan mltiples compaeros tambin deben hacerse el anlisis para Engineer, manufacturing clamidia. Se recomienda realizar anlisis para detectar otras enfermedades de transmisin sexual si es sexualmente [place] y tiene riesgos.   La osteoporosis es una enfermedad en la que los huesos pierden los minerales y la fuerza por el avance de la edad. El  resultado pueden ser fracturas graves en los [place]. El riesgo de osteoporosis puede identificarse con Neomia Dear prueba de densidad sea. Las mujeres de ms de 65 aos y las que tengan riesgos de sufrir fracturas u osteoporosis deben pedir consejo a su mdico. Consulte a su mdico si debe tomar un suplemento de calcio o de vitamina D para reducir el riesgo de osteoporosis.   La menopausia se asocia a sntomas y riesgos fsicos. Se dispone de una terapia de reemplazo hormonal para disminuir los sntomas y [place]. Consulte a su mdico para saber si la terapia de reemplazo hormonal es conveniente para usted.   Use una pantalla solar con un factor SPF de 30 o mayor. Aplique pantalla de Pietro Cassis y repetida a lo largo del Futures trader. Pngase al resguardo del sol cuando la sombra sea ms pequea que usted. Protjase usando mangas y [employer] Data, un sombrero de ala ancha y gafas para el sol todo el ao, siempre que se encuentre en el exterior.   Informe a su mdico si aparecen nuevos lunares o los que tiene se modifican, especialmente en forma y color. Tambin notifique al mdico si un lunar es ms grande que el tamao de una goma de Paramedic.   Mantngase al da con las vacunas.  Document Released: 10/21/2011 [facility] Patient Information 2012 [place], [place].  Liquen escleroso  (Lichen Sclerosus)  El liquen escleroso es una enfermedad de la piel. Puede aparecer en cualquier parte del cuerpo, pero generalmente aparece en la zona anal o genital. El liquen escleroso no es una infeccin ni un hongo. Las mujeres la sufren con ms frecuencia que los varones. CAUSAS  La causa no se conoce. Puede ser el resulado de un sistema inmunolgico muy activo o por la falta de ciertas hormonas. El liquen escleroso no se transmite de [place] persona a otra (no se contagia).  SNTOMAS  La piel puede aparecer:   Con reas blancas, delgadas y [place].   Algunas partes se ven gruesas y blancas.   Zonas rojas e hinchadas.    Grietas o fisuras.   Hematomas.   Ampollas con sangre.   Picazn intensa.  Tambin puede sentir dolor, picazn o ardor al [employer]oco[employer]. Tambin es frecuente la constipacin. DIAGNSTICO  El [employer] har un examen fsico. En algunos casos se enviar una muestra de tejido (biopsia) para Chiropractor.  TRATAMIENTO  El tratamiento consiste en aplicar una capa delgada de una crema con medicacin (corticoides tpicos) sobre las zonas con liquen escleroso. Utilice la crema slo en la forma indicada por el  mdico.  INSTRUCCIONES PARA EL CUIDADO EN EL HOGAR   Tome slo medicamentos de venta libre o recetados, segn las indicaciones del mdico.   Mantenga la zona vaginal tan limpia y seca como sea posible.  SOLICITE ATENCIN MDICA SI:  El dolor, la hinchazn o el enrojecimiento [place].  Document Released: 07/14/2011 Document Revised: 10/21/2011 [facility] Patient Information 2012 [place], [place].

## 2012-07-25 ENCOUNTER — Other Ambulatory Visit: Payer: Self-pay | Admitting: *Deleted

## 2012-07-25 DIAGNOSIS — Z1211 Encounter for screening for malignant neoplasm of colon: Secondary | ICD-10-CM

## 2012-07-26 ENCOUNTER — Encounter: Payer: Self-pay | Admitting: Gynecology

## 2012-07-28 ENCOUNTER — Other Ambulatory Visit: Payer: Self-pay | Admitting: Gynecology

## 2012-07-28 DIAGNOSIS — Z1211 Encounter for screening for malignant neoplasm of colon: Secondary | ICD-10-CM

## 2012-09-13 ENCOUNTER — Ambulatory Visit (INDEPENDENT_AMBULATORY_CARE_PROVIDER_SITE_OTHER): Payer: 59

## 2012-09-13 DIAGNOSIS — M949 Disorder of cartilage, unspecified: Secondary | ICD-10-CM

## 2012-09-13 DIAGNOSIS — Z780 Asymptomatic menopausal state: Secondary | ICD-10-CM

## 2012-09-13 DIAGNOSIS — M8580 Other specified disorders of bone density and structure, unspecified site: Secondary | ICD-10-CM

## 2012-09-13 DIAGNOSIS — M899 Disorder of bone, unspecified: Secondary | ICD-10-CM

## 2013-09-12 ENCOUNTER — Encounter: Payer: Self-pay | Admitting: Gynecology

## 2014-09-16 ENCOUNTER — Encounter: Payer: Self-pay | Admitting: Gynecology

## 2020-03-07 NOTE — Patient Instructions (Addendum)
DUE TO COVID-19 ONLY ONE VISITOR IS ALLOWED TO COME WITH YOU AND STAY IN THE WAITING ROOM ONLY DURING PRE OP AND PROCEDURE DAY OF SURGERY. THE 1 VISITOR MAY VISIT WITH YOU AFTER SURGERY IN YOUR PRIVATE ROOM DURING VISITING HOURS ONLY!  YOU NEED TO HAVE A COVID 19 TEST ON 03/17/20 @  2:45 pm, THIS TEST MUST BE DONE BEFORE SURGERY, COME  [address], [address] [place] , [id number].  ([place]) ONCE YOUR COVID TEST IS COMPLETED, PLEASE BEGIN THE QUARANTINE INSTRUCTIONS AS OUTLINED IN YOUR HANDOUT.                Felicia [name]    Your procedure is scheduled on: 03/20/20   Report to [facility] Main  Entrance   Report to admitting at: 7:30 AM     Call this number if you have problems the morning of surgery [phone]    Remember:    [place], [place].     Take these medicines the morning of surgery with A SIP OF WATER: AMLODIPINE,LEVOTHYROXINE.                                 You may not have any metal on your body including hair pins and              piercings  Do not wear jewelry, make-up, lotions, powders or perfumes, deodorant             Do not wear nail polish on your fingernails.  Do not shave  48 hours prior to surgery.                Do not bring valuables to the hospital. [place].  Contacts, dentures or bridgework may not be worn into surgery.  Leave suitcase in the car. After surgery it may be brought to your room.     Patients discharged the day of surgery will not be allowed to drive home. IF YOU ARE HAVING SURGERY AND GOING HOME THE SAME DAY, YOU MUST HAVE AN ADULT TO DRIVE YOU HOME AND BE WITH YOU FOR 24 HOURS. YOU MAY GO HOME BY TAXI OR UBER OR ORTHERWISE, BUT AN ADULT MUST ACCOMPANY YOU HOME AND STAY WITH YOU FOR 24 HOURS.  Name and phone number of your driver:  Special Instructions: N/A              Please read over  the following fact sheets you were given: _____________________________________________________________________             NO SOLID FOOD AFTER MIDNIGHT THE NIGHT PRIOR TO SURGERY. NOTHING BY MOUTH EXCEPT CLEAR LIQUIDS UNTIL: 7:00 AM . PLEASE FINISH ENSURE DRINK PER SURGEON ORDER  WHICH NEEDS TO BE COMPLETED AT: 7:00 AM .   CLEAR LIQUID DIET   Foods Allowed                                                                     Foods Excluded  Coffee and tea, regular and decaf                             liquids that you cannot  Plain Jell-O any favor except red or purple                                           see through such as: Fruit ices (not with fruit pulp)                                     milk, soups, orange juice  Iced Popsicles                                    All solid food Carbonated beverages, regular and diet                                    Cranberry, grape and apple juices Sports drinks like Gatorade Lightly seasoned clear broth or consume(fat free) Sugar, honey syrup  Sample Menu Breakfast                                Lunch                                     Supper Cranberry juice                    Beef broth                            Chicken broth Jell-O                                     Grape juice                           Apple juice Coffee or tea                        Jell-O                                      Popsicle                                                Coffee or tea                        Coffee or tea  _____________________________________________________________________  [facility]- Preparing for Total Shoulder Arthroplasty    Before surgery, you can play an important role. Because skin is not sterile, your skin  needs to be as free of germs as possible. You can reduce the number of germs on your skin by using the following products. . Benzoyl Peroxide Gel o Reduces the number of germs present on the skin o Applied twice a day  to shoulder area starting two days before surgery    ==================================================================  Please follow these instructions carefully:  BENZOYL PEROXIDE 5% GEL  Please do not use if you have an allergy to benzoyl peroxide.   If your skin becomes reddened/irritated stop using the benzoyl peroxide.  Starting two days before surgery, apply as follows: 1. Apply benzoyl peroxide in the morning and at night. Apply after taking a shower. If you are not taking a shower clean entire shoulder front, back, and side along with the armpit with a clean wet washcloth.  2. Place a quarter-sized dollop on your shoulder and rub in thoroughly, making sure to cover the front, back, and side of your shoulder, along with the armpit.   2 days before ____ AM   ____ PM              1 day before ____ AM   ____ PM                         3. Do this twice a day for two days.  (Last application is the night before surgery, AFTER using the CHG soap as described below).  4. Do NOT apply benzoyl peroxide gel on the day of surgery.   [place] - Preparing for Surgery Before surgery, you can play an important role.  Because skin is not sterile, your skin needs to be as free of germs as possible.  You can reduce the number of germs on your skin by washing with CHG (chlorahexidine gluconate) soap before surgery.  CHG is an antiseptic cleaner which kills germs and bonds with the skin to continue killing germs even after washing. Please DO NOT use if you have an allergy to CHG or antibacterial soaps.  If your skin becomes reddened/irritated stop using the CHG and inform your nurse when you arrive at Short Stay. Do not shave (including legs and underarms) for at least 48 hours prior to the first CHG shower.  You may shave your face/neck. Please follow these instructions carefully:  1.  Shower with CHG Soap the night before surgery and the  morning of Surgery.  2.  If you choose to wash your  hair, wash your hair first as usual with your  normal  shampoo.  3.  After you shampoo, rinse your hair and body thoroughly to remove the  shampoo.                           4.  Use CHG as you would any other liquid soap.  You can apply chg directly  to the skin and wash                       Gently with a scrungie or clean washcloth.  5.  Apply the CHG Soap to your body ONLY FROM THE NECK DOWN.   Do not use on face/ open                           Wound or open sores. Avoid contact with eyes, ears mouth and genitals (private parts).  Wash face,  Genitals (private parts) with your normal soap.             6.  Wash thoroughly, paying special attention to the area where your surgery  will be performed.  7.  Thoroughly rinse your body with warm water from the neck down.  8.  DO NOT shower/wash with your normal soap after using and rinsing off  the CHG Soap.                9.  Pat yourself dry with a clean towel.            10.  Wear clean pajamas.            11.  Place clean sheets on your bed the night of your first shower and do not  sleep with pets. Day of Surgery : Do not apply any lotions/deodorants the morning of surgery.  Please wear clean clothes to the hospital/surgery center.  FAILURE TO FOLLOW THESE INSTRUCTIONS MAY RESULT IN THE CANCELLATION OF YOUR SURGERY PATIENT SIGNATURE_________________________________  NURSE SIGNATURE__________________________________  ________________________________________________________________________ [facility] Before surgery, you can play an important role.  Because skin is not sterile, your skin needs to be as free of germs as possible.  You can reduce the number of germs on your skin by washing with CHG (chlorahexidine gluconate) soap before surgery.  CHG is an antiseptic cleaner which kills germs and bonds with the skin to continue killing germs even after washing. Please DO NOT use if you have an allergy  to CHG or antibacterial soaps.  If your skin becomes reddened/irritated stop using the CHG and inform your nurse when you arrive at Short Stay. Do not shave (including legs and underarms) for at least 48 hours prior to the first CHG shower.  You may shave your face/neck. Please follow these instructions carefully:  1.  Shower with CHG Soap the night before surgery and the  morning of Surgery.  2.  If you choose to wash your hair, wash your hair first as usual with your  normal  shampoo.  3.  After you shampoo, rinse your hair and body thoroughly to remove the  shampoo.                           4.  Use CHG as you would any other liquid soap.  You can apply chg directly  to the skin and wash                       Gently with a scrungie or clean washcloth.  5.  Apply the CHG Soap to your body ONLY FROM THE NECK DOWN.   Do not use on face/ open                           Wound or open sores. Avoid contact with eyes, ears mouth and genitals (private parts).                       Wash face,  Genitals (private parts) with your normal soap.             6.  Wash thoroughly, paying special attention to the area where your surgery  will be performed.  7.  Thoroughly rinse your body with warm water from the neck down.  8.  DO NOT  shower/wash with your normal soap after using and rinsing off  the CHG Soap.                9.  Pat yourself dry with a clean towel.            10.  Wear clean pajamas.            11.  Place clean sheets on your bed the night of your first shower and do not  sleep with pets. Day of Surgery : Do not apply any lotions/deodorants the morning of surgery.  Please wear clean clothes to the hospital/surgery center.  FAILURE TO FOLLOW THESE INSTRUCTIONS MAY RESULT IN THE CANCELLATION OF YOUR SURGERY PATIENT SIGNATURE_________________________________  NURSE SIGNATURE__________________________________  ________________________________________________________________________   Felicia [name]  An incentive spirometer is a tool that can help keep your lungs clear and active. This tool measures how well you are filling your lungs with each breath. Taking long deep breaths may help reverse or decrease the chance of developing breathing (pulmonary) problems (especially infection) following:  A long period of time when you are unable to move or be active. BEFORE THE PROCEDURE   If the spirometer includes an indicator to show your best effort, your nurse or respiratory therapist will set it to a desired goal.  If possible, sit up straight or lean slightly forward. Try not to slouch.  Hold the incentive spirometer in an upright position. INSTRUCTIONS FOR USE  1. Sit on the edge of your bed if possible, or sit up as far as you can in bed or on a chair. 2. Hold the incentive spirometer in an upright position. 3. Breathe out normally. 4. Place the mouthpiece in your mouth and seal your lips tightly around it. 5. Breathe in slowly and as deeply as possible, raising the piston or the ball toward the top of the column. 6. Hold your breath for 3-5 seconds or for as long as possible. Allow the piston or ball to fall to the bottom of the column. 7. Remove the mouthpiece from your mouth and breathe out normally. 8. Rest for a few seconds and repeat Steps 1 through 7 at least 10 times every 1-2 hours when you are awake. Take your time and take a few normal breaths between deep breaths. 9. The spirometer may include an indicator to show your best effort. Use the indicator as a goal to work toward during each repetition. 10. After each set of 10 deep breaths, practice coughing to be sure your lungs are clear. If you have an incision (the cut made at the time of surgery), support your incision when coughing by placing a pillow or rolled up towels firmly against it. Once you are able to get out of bed, walk around indoors and cough well. You may stop using the incentive spirometer when  instructed by your caregiver.  RISKS AND COMPLICATIONS  Take your time so you do not get dizzy or light-headed.  If you are in pain, you may need to take or ask for pain medication before doing incentive spirometry. It is harder to take a deep breath if you are having pain. AFTER USE  Rest and breathe slowly and easily.  It can be helpful to keep track of a log of your progress. Your caregiver can provide you with a simple table to help with this. If you are using the spirometer at home, follow these instructions: SEEK MEDICAL CARE IF:   You are having difficultly  using the spirometer.  You have trouble using the spirometer as often as instructed.  Your pain medication is not giving enough relief while using the spirometer.  You develop fever of 100.5 F (38.1 C) or higher. SEEK IMMEDIATE MEDICAL CARE IF:   You cough up bloody sputum that had not been present before.  You develop fever of 102 F (38.9 C) or greater.  You develop worsening pain at or near the incision site. MAKE SURE YOU:   Understand these instructions.  Will watch your condition.  Will get help right away if you are not doing well or get worse. Document Released: 03/14/2007 Document Revised: 01/24/2012 Document Reviewed: 05/15/2007 [facility] Patient Information 2014 [place], [place].   ________________________________________________________________________

## 2020-03-10 ENCOUNTER — Encounter (HOSPITAL_COMMUNITY)
Admission: RE | Admit: 2020-03-10 | Discharge: 2020-03-10 | Disposition: A | Discharge status: Home / Self Care | Payer: 59 | Source: Ambulatory Visit | Attending: Orthopedic Surgery | Admitting: Orthopedic Surgery

## 2020-03-10 ENCOUNTER — Encounter (HOSPITAL_COMMUNITY): Payer: Self-pay

## 2020-03-10 ENCOUNTER — Other Ambulatory Visit: Payer: Self-pay

## 2020-03-10 DIAGNOSIS — Z01818 Encounter for other preprocedural examination: Secondary | ICD-10-CM | POA: Insufficient documentation

## 2020-03-10 HISTORY — DX: Dyspnea, unspecified: R06.00

## 2020-03-10 HISTORY — DX: Atherosclerotic heart disease of native coronary artery without angina pectoris: I25.10

## 2020-03-10 LAB — CBC
HCT: 39.3 % (ref 36.0–46.0)
Hemoglobin: 12.6 g/dL (ref 12.0–15.0)
MCH: 33.4 pg (ref 26.0–34.0)
MCHC: 32.1 g/dL (ref 30.0–36.0)
MCV: 104.2 fL — ABNORMAL HIGH (ref 80.0–100.0)
Platelets: 339 10*3/uL (ref 150–400)
RBC: 3.77 MIL/uL — ABNORMAL LOW (ref 3.87–5.11)
RDW: 12.7 % (ref 11.5–15.5)
WBC: 5.9 10*3/uL (ref 4.0–10.5)
nRBC: 0 % (ref 0.0–0.2)

## 2020-03-10 LAB — BASIC METABOLIC PANEL
Anion gap: 10 (ref 5–15)
BUN: 14 mg/dL (ref 8–23)
CO2: 24 mmol/L (ref 22–32)
Calcium: 8.8 mg/dL — ABNORMAL LOW (ref 8.9–10.3)
Chloride: 107 mmol/L (ref 98–111)
Creatinine, Ser: 1.38 mg/dL — ABNORMAL HIGH (ref 0.44–1.00)
GFR calc Af Amer: 45 mL/min — ABNORMAL LOW (ref >60)
GFR calc non Af Amer: 39 mL/min — ABNORMAL LOW (ref >60)
Glucose, Bld: 126 mg/dL — ABNORMAL HIGH (ref 70–99)
Potassium: 4 mmol/L (ref 3.5–5.1)
Sodium: 141 mmol/L (ref 135–145)

## 2020-03-10 LAB — SURGICAL PCR SCREEN
MRSA, PCR: NEGATIVE
Staphylococcus aureus: POSITIVE — AB

## 2020-03-10 NOTE — Progress Notes (Signed)
PCP - Dr. Dennis Bast. LOV: 12/26/19 Cardiologist -   Chest x-ray -  EKG -  Stress Test -  ECHO -  Cardiac Cath -   Sleep Study -  CPAP -   Fasting Blood Sugar -  Checks Blood Sugar _____ times a day  Blood Thinner Instructions: Aspirin Instructions: Last Dose:  Anesthesia review:   Patient denies shortness of breath, fever, cough and chest pain at PAT appointment   Patient verbalized understanding of instructions that were given to them at the PAT appointment. Patient was also instructed that they will need to review over the PAT instructions again at home before surgery.

## 2020-03-11 NOTE — Progress Notes (Signed)
PCR: positive for STAPH 

## 2020-03-17 ENCOUNTER — Other Ambulatory Visit (HOSPITAL_COMMUNITY)
Admission: RE | Admit: 2020-03-17 | Discharge: 2020-03-17 | Disposition: A | Discharge status: Home / Self Care | Payer: 59 | Source: Ambulatory Visit | Attending: Orthopedic Surgery | Admitting: Orthopedic Surgery

## 2020-03-17 DIAGNOSIS — Z20822 Contact with and (suspected) exposure to covid-19: Secondary | ICD-10-CM | POA: Diagnosis not present

## 2020-03-17 DIAGNOSIS — Z01812 Encounter for preprocedural laboratory examination: Secondary | ICD-10-CM | POA: Diagnosis present

## 2020-03-18 LAB — SARS CORONAVIRUS 2 (TAT 6-24 HRS): SARS Coronavirus 2: NEGATIVE

## 2020-03-20 ENCOUNTER — Ambulatory Visit (HOSPITAL_COMMUNITY)
Admission: RE | Admit: 2020-03-20 | Discharge: 2020-03-21 | Disposition: A | Discharge status: Home / Self Care | Payer: 59 | Attending: Orthopedic Surgery | Admitting: Orthopedic Surgery

## 2020-03-20 ENCOUNTER — Ambulatory Visit (HOSPITAL_COMMUNITY): Payer: 59 | Admitting: Physician Assistant

## 2020-03-20 ENCOUNTER — Other Ambulatory Visit: Payer: Self-pay

## 2020-03-20 ENCOUNTER — Encounter (HOSPITAL_COMMUNITY)
Admission: RE | Disposition: A | Discharge status: Home / Self Care | Payer: Self-pay | Source: Home / Self Care | Attending: Orthopedic Surgery

## 2020-03-20 ENCOUNTER — Ambulatory Visit (HOSPITAL_COMMUNITY): Payer: 59 | Admitting: Certified Registered"

## 2020-03-20 ENCOUNTER — Encounter (HOSPITAL_COMMUNITY): Payer: Self-pay | Admitting: Orthopedic Surgery

## 2020-03-20 DIAGNOSIS — Z96611 Presence of right artificial shoulder joint: Secondary | ICD-10-CM

## 2020-03-20 DIAGNOSIS — I2510 Atherosclerotic heart disease of native coronary artery without angina pectoris: Secondary | ICD-10-CM | POA: Insufficient documentation

## 2020-03-20 DIAGNOSIS — Z79899 Other long term (current) drug therapy: Secondary | ICD-10-CM | POA: Diagnosis not present

## 2020-03-20 DIAGNOSIS — I10 Essential (primary) hypertension: Secondary | ICD-10-CM | POA: Insufficient documentation

## 2020-03-20 DIAGNOSIS — M129 Arthropathy, unspecified: Secondary | ICD-10-CM | POA: Insufficient documentation

## 2020-03-20 DIAGNOSIS — Z79890 Hormone replacement therapy (postmenopausal): Secondary | ICD-10-CM | POA: Diagnosis not present

## 2020-03-20 DIAGNOSIS — M75101 Unspecified rotator cuff tear or rupture of right shoulder, not specified as traumatic: Secondary | ICD-10-CM | POA: Insufficient documentation

## 2020-03-20 DIAGNOSIS — E039 Hypothyroidism, unspecified: Secondary | ICD-10-CM | POA: Diagnosis not present

## 2020-03-20 HISTORY — PX: REVERSE SHOULDER ARTHROPLASTY: SHX5054

## 2020-03-20 SURGERY — ARTHROPLASTY, SHOULDER, TOTAL, REVERSE
Anesthesia: General | Site: Shoulder | Laterality: Right

## 2020-03-20 MED ORDER — FENTANYL CITRATE (PF) 100 MCG/2ML IJ SOLN
INTRAMUSCULAR | Status: AC
  Administered 2020-03-20: 100 ug via INTRAVENOUS
  Filled 2020-03-20: qty 2

## 2020-03-20 MED ORDER — ACETAMINOPHEN 325 MG PO TABS: 325.0000 mg | ORAL_TABLET | ORAL | Status: DC | PRN

## 2020-03-20 MED ORDER — BUPIVACAINE-EPINEPHRINE (PF) 0.5% -1:200000 IJ SOLN
INTRAMUSCULAR | Status: DC | PRN
Start: 2020-03-20 — End: 2020-03-20
  Administered 2020-03-20 (×5): 3 mL via PERINEURAL

## 2020-03-20 MED ORDER — PROPOFOL 10 MG/ML IV BOLUS
INTRAVENOUS | Status: AC
  Filled 2020-03-20: qty 20

## 2020-03-20 MED ORDER — BUPIVACAINE LIPOSOME 1.3 % IJ SUSP
INTRAMUSCULAR | Status: DC | PRN
  Administered 2020-03-20 (×5): 2 mL via PERINEURAL

## 2020-03-20 MED ORDER — ONDANSETRON HCL 4 MG/2ML IJ SOLN
INTRAMUSCULAR | Status: DC | PRN
  Administered 2020-03-20: 4 mg via INTRAVENOUS

## 2020-03-20 MED ORDER — OXYCODONE HCL 5 MG PO TABS: 5.0000 mg | ORAL_TABLET | ORAL | Status: DC | PRN

## 2020-03-20 MED ORDER — METHOCARBAMOL 500 MG IVPB - SIMPLE MED
500.0000 mg | Freq: Four times a day (QID) | INTRAVENOUS | Status: DC | PRN
  Filled 2020-03-20: qty 50

## 2020-03-20 MED ORDER — ONDANSETRON HCL 4 MG/2ML IJ SOLN: 4.0000 mg | Freq: Four times a day (QID) | INTRAMUSCULAR | Status: DC | PRN

## 2020-03-20 MED ORDER — KETOROLAC TROMETHAMINE 15 MG/ML IJ SOLN
7.5000 mg | Freq: Four times a day (QID) | INTRAMUSCULAR | Status: DC
  Filled 2020-03-20: qty 1

## 2020-03-20 MED ORDER — PROPOFOL 10 MG/ML IV BOLUS
INTRAVENOUS | Status: DC | PRN
  Administered 2020-03-20: 140 mg via INTRAVENOUS

## 2020-03-20 MED ORDER — ACETAMINOPHEN 160 MG/5ML PO SOLN: 325.0000 mg | ORAL | Status: DC | PRN

## 2020-03-20 MED ORDER — MAGNESIUM CITRATE PO SOLN: 1.0000 | Freq: Once | ORAL | Status: DC | PRN

## 2020-03-20 MED ORDER — OXYCODONE HCL 5 MG/5ML PO SOLN: 5.0000 mg | Freq: Once | ORAL | Status: DC | PRN

## 2020-03-20 MED ORDER — HYDROMORPHONE HCL 1 MG/ML IJ SOLN: 0.5000 mg | INTRAMUSCULAR | Status: DC | PRN

## 2020-03-20 MED ORDER — LEVOTHYROXINE SODIUM 112 MCG PO TABS: 112.0000 ug | ORAL_TABLET | Freq: Every day | ORAL | Status: DC

## 2020-03-20 MED ORDER — CEFAZOLIN SODIUM-DEXTROSE 2-4 GM/100ML-% IV SOLN
INTRAVENOUS | Status: AC
  Filled 2020-03-20: qty 100

## 2020-03-20 MED ORDER — CEFAZOLIN SODIUM-DEXTROSE 2-4 GM/100ML-% IV SOLN
2.0000 g | INTRAVENOUS | Status: AC
  Administered 2020-03-20: 2 g via INTRAVENOUS

## 2020-03-20 MED ORDER — ONDANSETRON HCL 4 MG PO TABS: 4.0000 mg | ORAL_TABLET | Freq: Four times a day (QID) | ORAL | Status: DC | PRN

## 2020-03-20 MED ORDER — LIDOCAINE 2% (20 MG/ML) 5 ML SYRINGE
INTRAMUSCULAR | Status: AC
  Filled 2020-03-20: qty 5

## 2020-03-20 MED ORDER — POLYETHYLENE GLYCOL 3350 17 G PO PACK: 17.0000 g | PACK | Freq: Every day | ORAL | Status: DC | PRN

## 2020-03-20 MED ORDER — ACETAMINOPHEN 325 MG PO TABS
325.0000 mg | ORAL_TABLET | Freq: Four times a day (QID) | ORAL | Status: DC | PRN
  Administered 2020-03-20 – 2020-03-21 (×2): 650 mg via ORAL
  Filled 2020-03-20 (×2): qty 2

## 2020-03-20 MED ORDER — LACTATED RINGERS IV SOLN: INTRAVENOUS | Status: DC

## 2020-03-20 MED ORDER — BISACODYL 5 MG PO TBEC: 5.0000 mg | DELAYED_RELEASE_TABLET | Freq: Every day | ORAL | Status: DC | PRN

## 2020-03-20 MED ORDER — FENTANYL CITRATE (PF) 100 MCG/2ML IJ SOLN: 50.0000 ug | INTRAMUSCULAR | Status: AC

## 2020-03-20 MED ORDER — MIDAZOLAM HCL 2 MG/2ML IJ SOLN
INTRAMUSCULAR | Status: AC
  Administered 2020-03-20: 2 mg via INTRAVENOUS
  Filled 2020-03-20: qty 2

## 2020-03-20 MED ORDER — METOCLOPRAMIDE HCL 5 MG PO TABS: 5.0000 mg | ORAL_TABLET | Freq: Three times a day (TID) | ORAL | Status: DC | PRN

## 2020-03-20 MED ORDER — DEXAMETHASONE SODIUM PHOSPHATE 10 MG/ML IJ SOLN
INTRAMUSCULAR | Status: DC | PRN
  Administered 2020-03-20: 8 mg via INTRAVENOUS

## 2020-03-20 MED ORDER — OXYCODONE HCL 5 MG PO TABS: 10.0000 mg | ORAL_TABLET | ORAL | Status: DC | PRN

## 2020-03-20 MED ORDER — AMLODIPINE BESYLATE 5 MG PO TABS: 5.0000 mg | ORAL_TABLET | ORAL | Status: DC

## 2020-03-20 MED ORDER — ONDANSETRON HCL 4 MG/2ML IJ SOLN
INTRAMUSCULAR | Status: AC
  Filled 2020-03-20: qty 2

## 2020-03-20 MED ORDER — METOCLOPRAMIDE HCL 5 MG/ML IJ SOLN: 5.0000 mg | Freq: Three times a day (TID) | INTRAMUSCULAR | Status: DC | PRN

## 2020-03-20 MED ORDER — PHENYLEPHRINE HCL (PRESSORS) 10 MG/ML IV SOLN
INTRAVENOUS | Status: AC
  Filled 2020-03-20: qty 1

## 2020-03-20 MED ORDER — TRAMADOL HCL 50 MG PO TABS
50.0000 mg | ORAL_TABLET | Freq: Four times a day (QID) | ORAL | Status: DC | PRN
  Administered 2020-03-21: 50 mg via ORAL
  Filled 2020-03-20: qty 1

## 2020-03-20 MED ORDER — LEVOTHYROXINE SODIUM 125 MCG PO TABS
125.0000 ug | ORAL_TABLET | Freq: Every day | ORAL | Status: DC
  Administered 2020-03-21: 125 ug via ORAL
  Filled 2020-03-20: qty 1

## 2020-03-20 MED ORDER — DIPHENHYDRAMINE HCL 12.5 MG/5ML PO ELIX: 12.5000 mg | ORAL_SOLUTION | ORAL | Status: DC | PRN

## 2020-03-20 MED ORDER — LIDOCAINE 2% (20 MG/ML) 5 ML SYRINGE
INTRAMUSCULAR | Status: DC | PRN
  Administered 2020-03-20: 40 mg via INTRAVENOUS

## 2020-03-20 MED ORDER — DEXAMETHASONE SODIUM PHOSPHATE 10 MG/ML IJ SOLN
INTRAMUSCULAR | Status: AC
  Filled 2020-03-20: qty 1

## 2020-03-20 MED ORDER — ROCURONIUM BROMIDE 10 MG/ML (PF) SYRINGE
PREFILLED_SYRINGE | INTRAVENOUS | Status: AC
  Filled 2020-03-20: qty 10

## 2020-03-20 MED ORDER — SODIUM CHLORIDE 0.9 % IR SOLN
Status: DC | PRN
  Administered 2020-03-20: 1000 mL

## 2020-03-20 MED ORDER — ROCURONIUM BROMIDE 10 MG/ML (PF) SYRINGE
PREFILLED_SYRINGE | INTRAVENOUS | Status: DC | PRN
  Administered 2020-03-20: 60 mg via INTRAVENOUS
  Administered 2020-03-20: 10 mg via INTRAVENOUS

## 2020-03-20 MED ORDER — ALUM & MAG HYDROXIDE-SIMETH 200-200-20 MG/5ML PO SUSP: 30.0000 mL | ORAL | Status: DC | PRN

## 2020-03-20 MED ORDER — OXYCODONE HCL 5 MG PO TABS: 5.0000 mg | ORAL_TABLET | Freq: Once | ORAL | Status: DC | PRN

## 2020-03-20 MED ORDER — FENTANYL CITRATE (PF) 100 MCG/2ML IJ SOLN: 25.0000 ug | INTRAMUSCULAR | Status: DC | PRN

## 2020-03-20 MED ORDER — TRANEXAMIC ACID-NACL 1000-0.7 MG/100ML-% IV SOLN
1000.0000 mg | INTRAVENOUS | Status: AC
  Administered 2020-03-20: 1000 mg via INTRAVENOUS

## 2020-03-20 MED ORDER — PHENYLEPHRINE HCL-NACL 10-0.9 MG/250ML-% IV SOLN
INTRAVENOUS | Status: DC | PRN
  Administered 2020-03-20: 40 ug/min via INTRAVENOUS

## 2020-03-20 MED ORDER — MIDAZOLAM HCL 2 MG/2ML IJ SOLN: 1.0000 mg | INTRAMUSCULAR | Status: AC

## 2020-03-20 MED ORDER — MEPERIDINE HCL 50 MG/ML IJ SOLN: 6.2500 mg | INTRAMUSCULAR | Status: DC | PRN

## 2020-03-20 MED ORDER — ONDANSETRON HCL 4 MG/2ML IJ SOLN: 4.0000 mg | Freq: Once | INTRAMUSCULAR | Status: DC | PRN

## 2020-03-20 MED ORDER — TRANEXAMIC ACID-NACL 1000-0.7 MG/100ML-% IV SOLN
INTRAVENOUS | Status: AC
  Filled 2020-03-20: qty 100

## 2020-03-20 MED ORDER — DOCUSATE SODIUM 100 MG PO CAPS
100.0000 mg | ORAL_CAPSULE | Freq: Two times a day (BID) | ORAL | Status: DC
  Administered 2020-03-20 – 2020-03-21 (×2): 100 mg via ORAL
  Filled 2020-03-20 (×2): qty 1

## 2020-03-20 MED ORDER — AMLODIPINE BESYLATE 5 MG PO TABS
5.0000 mg | ORAL_TABLET | Freq: Every day | ORAL | Status: DC
  Administered 2020-03-21: 5 mg via ORAL
  Filled 2020-03-20: qty 1

## 2020-03-20 MED ORDER — STERILE WATER FOR IRRIGATION IR SOLN
Status: DC | PRN
  Administered 2020-03-20 (×2): 1000 mL

## 2020-03-20 MED ORDER — METHOCARBAMOL 500 MG PO TABS: 500.0000 mg | ORAL_TABLET | Freq: Four times a day (QID) | ORAL | Status: DC | PRN

## 2020-03-20 MED ORDER — PANTOPRAZOLE SODIUM 40 MG PO TBEC
40.0000 mg | DELAYED_RELEASE_TABLET | Freq: Every day | ORAL | Status: DC
  Administered 2020-03-21: 40 mg via ORAL
  Filled 2020-03-20: qty 1

## 2020-03-20 MED ORDER — KETOROLAC TROMETHAMINE 15 MG/ML IJ SOLN: 15.0000 mg | Freq: Once | INTRAMUSCULAR | Status: DC

## 2020-03-20 MED ORDER — PHENOL 1.4 % MT LIQD: 1.0000 | OROMUCOSAL | Status: DC | PRN

## 2020-03-20 MED ORDER — SUGAMMADEX SODIUM 200 MG/2ML IV SOLN
INTRAVENOUS | Status: DC | PRN
  Administered 2020-03-20: 140 mg via INTRAVENOUS

## 2020-03-20 MED ORDER — MENTHOL 3 MG MT LOZG: 1.0000 | LOZENGE | OROMUCOSAL | Status: DC | PRN

## 2020-03-20 SURGICAL SUPPLY — 73 items
ADH SKN CLS APL DERMABOND .7 (GAUZE/BANDAGES/DRESSINGS) ×1
AID PSTN UNV HD RSTRNT DISP (MISCELLANEOUS) ×1
BAG SPEC THK2 15X12 ZIP CLS (MISCELLANEOUS) ×1
BAG ZIPLOCK 12X15 (MISCELLANEOUS) ×3 IMPLANT
BLADE SAW SGTL 83.5X18.5 (BLADE) ×3 IMPLANT
BSPLAT GLND +2X24 MDLR (Joint) ×1 IMPLANT
COOLER ICEMAN CLASSIC (MISCELLANEOUS) IMPLANT
COVER BACK TABLE 60X90IN (DRAPES) ×3 IMPLANT
COVER SURGICAL LIGHT HANDLE (MISCELLANEOUS) ×3 IMPLANT
COVER WAND RF STERILE (DRAPES) IMPLANT
CUP SUT UNIV REVERS 36 NEUTRAL (Cup) ×3 IMPLANT
DERMABOND ADVANCED (GAUZE/BANDAGES/DRESSINGS) ×2
DERMABOND ADVANCED .7 DNX12 (GAUZE/BANDAGES/DRESSINGS) ×1 IMPLANT
DRAPE INCISE IOBAN 66X45 STRL (DRAPES) IMPLANT
DRAPE ORTHO SPLIT 77X108 STRL (DRAPES) ×6
DRAPE SHEET LG 3/4 BI-LAMINATE (DRAPES) ×3 IMPLANT
DRAPE SURG 17X11 SM STRL (DRAPES) ×3 IMPLANT
DRAPE SURG ORHT 6 SPLT 77X108 (DRAPES) ×2 IMPLANT
DRAPE U-SHAPE 47X51 STRL (DRAPES) ×3 IMPLANT
DRSG AQUACEL AG ADV 3.5X10 (GAUZE/BANDAGES/DRESSINGS) ×3 IMPLANT
DURAPREP 26ML APPLICATOR (WOUND CARE) ×3 IMPLANT
ELECT BLADE TIP CTD 4 INCH (ELECTRODE) ×3 IMPLANT
ELECT REM PT RETURN 15FT ADLT (MISCELLANEOUS) ×3 IMPLANT
FACESHIELD WRAPAROUND (MASK) ×12 IMPLANT
GLENOID UNI REV MOD 24 +2 LAT (Joint) ×3 IMPLANT
GLENOSPHERE 36 +4 LAT/24 (Joint) ×3 IMPLANT
GLOVE BIO SURGEON STRL SZ7.5 (GLOVE) ×3 IMPLANT
GLOVE BIO SURGEON STRL SZ8 (GLOVE) ×3 IMPLANT
GLOVE SS BIOGEL STRL SZ 7 (GLOVE) ×1 IMPLANT
GLOVE SS BIOGEL STRL SZ 7.5 (GLOVE) ×1 IMPLANT
GLOVE SUPERSENSE BIOGEL SZ 7 (GLOVE) ×2
GLOVE SUPERSENSE BIOGEL SZ 7.5 (GLOVE) ×2
GLOVE SURG SYN 7.0 (GLOVE) IMPLANT
GLOVE SURG SYN 7.5  E (GLOVE)
GLOVE SURG SYN 7.5 E (GLOVE) IMPLANT
GLOVE SURG SYN 8.0 (GLOVE) IMPLANT
GOWN STRL REUS W/TWL LRG LVL3 (GOWN DISPOSABLE) ×6 IMPLANT
KIT BASIN (CUSTOM PROCEDURE TRAY) ×3 IMPLANT
KIT TURNOVER KIT A (KITS) IMPLANT
LINER HUMERAL 36 +3MM SM (Shoulder) ×3 IMPLANT
MANIFOLD NEPTUNE II (INSTRUMENTS) ×3 IMPLANT
NEEDLE TAPERED W/ NITINOL LOOP (MISCELLANEOUS) ×3 IMPLANT
NS IRRIG 1000ML POUR BTL (IV SOLUTION) ×3 IMPLANT
PACK SHOULDER (CUSTOM PROCEDURE TRAY) ×3 IMPLANT
PAD ARMBOARD 7.5X6 YLW CONV (MISCELLANEOUS) ×3 IMPLANT
PAD COLD SHLDR WRAP-ON (PAD) IMPLANT
PIN SET MODULAR GLENOID SYSTEM (PIN) ×3 IMPLANT
RESTRAINT HEAD UNIVERSAL NS (MISCELLANEOUS) ×3 IMPLANT
SCREW CENTRAL MOD 30MM (Screw) ×3 IMPLANT
SCREW PERI LOCK 5.5X16 (Screw) ×3 IMPLANT
SCREW PERI LOCK 5.5X32 (Screw) ×3 IMPLANT
SCREW PERI LOCK 5.5X36 (Screw) ×3 IMPLANT
SCREW PERIPHERAL 5.5X20 LOCK (Screw) ×3 IMPLANT
SLING ARM FOAM STRAP LRG (SOFTGOODS) ×3 IMPLANT
SLING ARM FOAM STRAP MED (SOFTGOODS) IMPLANT
SPONGE LAP 18X18 RF (DISPOSABLE) IMPLANT
STEM HUMERAL UNI REVERS SZ6 (Stem) ×3 IMPLANT
SUCTION FRAZIER HANDLE 12FR (TUBING) ×3
SUCTION TUBE FRAZIER 12FR DISP (TUBING) ×1 IMPLANT
SUT FIBERTAPE CERCLAGE 2 48 (SUTURE) ×3 IMPLANT
SUT FIBERWIRE #2 38 T-5 BLUE (SUTURE)
SUT MNCRL AB 3-0 PS2 18 (SUTURE) ×3 IMPLANT
SUT MON AB 2-0 CT1 36 (SUTURE) ×3 IMPLANT
SUT VIC AB 1 CT1 36 (SUTURE) ×3 IMPLANT
SUTURE FIBERWR #2 38 T-5 BLUE (SUTURE) IMPLANT
SUTURE TAPE 1.3 40 TPR END (SUTURE) ×2 IMPLANT
SUTURE TAPE TIGERLINK 1.3MM BL (SUTURE) ×1 IMPLANT
SUTURETAPE 1.3 40 TPR END (SUTURE) ×6
SUTURETAPE TIGERLINK 1.3MM BL (SUTURE) ×3
TOWEL OR 17X26 10 PK STRL BLUE (TOWEL DISPOSABLE) ×3 IMPLANT
TOWEL OR NON WOVEN STRL DISP B (DISPOSABLE) ×3 IMPLANT
WATER STERILE IRR 1000ML POUR (IV SOLUTION) ×6 IMPLANT
YANKAUER SUCT BULB TIP 10FT TU (MISCELLANEOUS) ×3 IMPLANT

## 2020-03-20 NOTE — Op Note (Signed)
03/20/2020  11:28 AM  PATIENT:   Felicia [name]  69 y.o. female  PRE-OPERATIVE DIAGNOSIS:  Right shoulder rotator cuff tear arthropathy  POST-OPERATIVE DIAGNOSIS: Same  PROCEDURE: Right shoulder reverse arthroplasty utilizing a press-fit size 6 Arthrex stem with a neutral metaphysis, +3 polyethylene insert, 36/+4 glenosphere on a small/+2 baseplate, with a metaphyseal cerclage suture utilized  SURGEON:  Uriah Trueba, Metta Clines M.D.  ASSISTANTS: Jenetta Loges, PA-C  ANESTHESIA:   General endotracheal and interscalene block with Exparel  EBL: 150 cc  SPECIMEN: None  Drains: None   PATIENT DISPOSITION:  PACU - hemodynamically stable.    PLAN OF CARE: Admit for overnight observation  Brief history:  Felicia [name] is a 69 year old female who has had chronic and progressively increasing right shoulder pain related to advanced rotator cuff tear arthropathy.  Her plain radiographs show anterior subluxation of the joint with an MRI scan confirming a chronic and severely atrophied and retracted rotator cuff tear.  Due to her increasing pain and functional mentation she is brought to the operating this time for planned right shoulder reverse arthroplasty.  Preoperatively counseled Felicia [name] regarding treatment options as well as the potential risks versus benefits thereof.  Possible surgical complications were reviewed including bleeding, infection, neurovascular injury, persistent pain, loss of motion, anesthetic complication, failure of the implant, and possible need for additional surgery.  She understands, and accepts, and agrees with our planned procedure.  Procedure in detail:  After undergoing routine preop evaluation patient received prophylactic antibiotics and interscalene block with Exparel was established in the holding area by the anesthesia department.  Patient subsequently placed supine on the operating table and underwent the smooth induction of a general endotracheal anesthesia.   Patient subsequently placed into the beachchair position and appropriately padded and protected.  The right shoulder girdle region was sterilely prepped and draped in standard fashion.  Timeout was called.  An anterior deltopectoral approach to the right shoulder is made to an 8 cm incision.  Skin flaps elevated dissection carried deeply deltopectoral interval developed from proximal to distal with the vein taken laterally and the conjoined tendon mobilized and retracted medially in the upper centimeter of the pectoralis major tendon was tenotomized for exposure.  The humeral head had been chronically subluxed anteriorly and there was scarification of the remnant of the anterior capsule and subscap to the undersurface of the conjoined tendon and this interval was carefully developed.  Remnant of the subscap was then divided from the lesser tuberosity and capsular attachments divided from the anterior inferior margins of the glenoid neck delivering humeral head through the wound.  Extra medullary guide was then used to perform our humeral head resection at approximately 30 degrees of retroversion and a metal cap was placed to the cut proximal humeral surface.  At this point we exposed the glenoid and performed a circumferential labral resection gaining complete visualization the periphery of the glenoid and a guidepin was then directed into the central aspect of the glenoid with an approximately 10 degree inferior tilt and the glenoid was then reamed with the central followed by the peripheral reamer and all debris was then removed and a stable subchondral bony bed was achieved.  Glenoid was then terminally prepared with the central drill and tap and our final baseplate was assembled with a 30 mm lag screw with this was then inserted with excellent purchase.  The peripheral locking screws were then placed all of which obtained good purchase and fixation.  A 39/+4 glenosphere was  then impacted onto the baseplate.   Central locking screw was then placed.  We then returned attention back to the proximal humerus.  The canal was prepared and reaming and then broaching up to a size 6.  The metaphysis was then reamed in the neutral position and a trial was placed and reduction showed good soft tissue balance good mobility good stability.  Trial was removed and our final implant was then assembled.  The final implant was then impacted and as it was terminally seated we did notice a small cortical violation in the anteromedial calcar region.  With this finding we utilized a fiber tape cerclage suture to reinforce and cerclage the metaphyseal region to prevent propagation of the small unicortical defect.  Once this was properly tightened and seated we then completed seating of the implant which obtained excellent purchase and fixation.  Trial reduction was then completed and the +3 polygave [place] the best soft tissue balance with good motion good stability.  The trial was then removed the final polywas impacted the final reduction was then performed.  We are very pleased with the overall mobility and stability and soft tissue balance.  The wound was copiously irrigated.  Final hemostasis was obtained.  There is no viable subscapularis tissue so the deltopectoral interval was closed with a series of figure-of-eight and 1 Vicryl sutures.  2-0 Monocryl used for subcu layer and intracuticular 3-0 Monocryl for the skin followed by Dermabond and Aquasol dressing right arm was placed in a sling and the patient was awakened, extubated, and taken to the recovery room in stable condition.  Ralene Bathe, PA-C was used as an Geophysicist/field seismologist throughout this case essential for help with positioning the patient, positioning extremity, tissue manipulation, implantation of the prosthesis, wound closure, and intraoperative decision-making.  Senaida Lange MD   Contact # [phone]

## 2020-03-20 NOTE — H&P (Signed)
Felicia [name]    Chief Complaint: Right shoulder rotator cuff tear arthropathy HPI: The patient is a 69 y.o. female with chronic and progressively increasing right shoulder pain related to advanced rotator cuff tear arthropathy.  Due to her increasing pain and functional mentation she is brought to the operating room at this time for planned right shoulder reverse arthroplasty  Past Medical History:  Diagnosis Date  . Anemia   . Asthma   . Coronary artery disease   . Dyspnea    with alergies  . Hypertension   . Hypothyroidism   . Lichen sclerosus    vulva  . Menopause   . Polyp of colon 04/2006   benign     Past Surgical History:  Procedure Laterality Date  . BREAST SURGERY  2003   left breast biopsy   . CARPAL TUNNEL RELEASE     LEFT  . CESAREAN SECTION    . COLONOSCOPY W/ BIOPSIES  MAY 2011    Family History  Problem Relation Age of Onset  . Cancer Maternal Grandmother        STOMACH  . Cancer Paternal Grandmother        STOMACH  . Osteoporosis Mother   . Osteoporosis Sister   . Cancer Maternal Grandfather        LIVER    Social History:  reports that she quit smoking about 7 years ago. Her smoking use included cigarettes. She has never used smokeless tobacco. She reports current alcohol use. She reports that she does not use drugs.   Medications Prior to Admission  Medication Sig Dispense Refill  . amLODipine (NORVASC) 5 MG tablet Take 5 mg by mouth once a week.     . Ascorbic Acid (VITAMIN C) 1000 MG tablet Take 1,000 mg by mouth daily.    . Cholecalciferol (VITAMIN D) 125 MCG (5000 UT) CAPS Take 5,000 Units by mouth daily.    Marland Kitchen levothyroxine (SYNTHROID, LEVOTHROID) 112 MCG tablet Take 112 mcg by mouth daily before breakfast.     . OVER THE COUNTER MEDICATION Take 1 tablet by mouth daily. Hydroxycut    . Potassium 99 MG TABS Take 99 mg by mouth daily.    . traMADol (ULTRAM) 50 MG tablet Take 50 mg by mouth 3 (three) times daily as needed for pain.    .  vitamin E 180 MG (400 UNITS) capsule Take 400 Units by mouth daily.       Physical Exam: Right shoulder demonstrates painful and guarded motion as noted at recent office visits.  Global weakness.  Plain radiographs confirm a high riding humeral head and changes consistent with a chronic rotator cuff tear arthropathy.  MRI confirms large retracted rotator cuff tear  Vitals  Temp:  [98.8 F (37.1 C)] 98.8 F (37.1 C) (05/06 0809) Pulse Rate:  [73-83] 79 (05/06 0857) Resp:  [10-14] 10 (05/06 0857) BP: (149-156)/(82-84) 156/84 (05/06 0845) SpO2:  [97 %-100 %] 99 % (05/06 0857) Weight:  [71 kg] 71 kg (05/06 0802)  Assessment/Plan  Impression: Right shoulder rotator cuff tear arthropathy  Plan of Action: Procedure(s): REVERSE SHOULDER ARTHROPLASTY  Fumi Guadron M Hayzen Lorenson 03/20/2020, 9:26 AM Contact # [phone]

## 2020-03-20 NOTE — Anesthesia Preprocedure Evaluation (Signed)
Anesthesia Evaluation  Patient identified by MRN, date of birth, ID band Patient awake    Reviewed: Allergy & Precautions, NPO status , Patient's Chart, lab work & pertinent test results  Airway Mallampati: I       Dental no notable dental hx. (+) Teeth Intact   Pulmonary former smoker,    Pulmonary exam normal breath sounds clear to auscultation       Cardiovascular hypertension, Pt. on medications Normal cardiovascular exam Rhythm:Regular Rate:Normal     Neuro/Psych    GI/Hepatic negative GI ROS, Neg liver ROS,   Endo/Other  Hypothyroidism   Renal/GU negative Renal ROS     Musculoskeletal   Abdominal Normal abdominal exam  (+)   Peds  Hematology   Anesthesia Other Findings   Reproductive/Obstetrics                             Anesthesia Physical Anesthesia Plan  ASA: II  Anesthesia Plan: General   Post-op Pain Management:  Regional for Post-op pain   Induction: Intravenous  PONV Risk Score and Plan: 3 and Ondansetron, Dexamethasone and Midazolam  Airway Management Planned: Oral ETT  Additional Equipment: None  Intra-op Plan:   Post-operative Plan: Extubation in OR  Informed Consent: I have reviewed the patients History and Physical, chart, labs and discussed the procedure including the risks, benefits and alternatives for the proposed anesthesia with the patient or authorized representative who has indicated his/her understanding and acceptance.     Dental advisory given  Plan Discussed with: CRNA  Anesthesia Plan Comments:         Anesthesia Quick Evaluation

## 2020-03-20 NOTE — Progress Notes (Signed)
Assisted Dr. Hatchett with right, ultrasound guided, interscalene  block. Side rails up, monitors on throughout procedure. See vital signs in flow sheet. Tolerated Procedure well.  

## 2020-03-20 NOTE — Anesthesia Procedure Notes (Signed)
Procedure Name: Intubation Date/Time: 03/20/2020 10:13 AM Performed by: Eben Burow, CRNA Pre-anesthesia Checklist: Patient identified, Emergency Drugs available, Suction available, Patient being monitored and Timeout performed Patient Re-evaluated:Patient Re-evaluated prior to induction Oxygen Delivery Method: Circle system utilized Preoxygenation: Pre-oxygenation with 100% oxygen Induction Type: IV induction Ventilation: Mask ventilation without difficulty Laryngoscope Size: Mac and 4 Grade View: Grade I Tube type: Oral Tube size: 7.0 mm Number of attempts: 1 Airway Equipment and Method: Stylet Placement Confirmation: ETT inserted through vocal cords under direct vision,  positive ETCO2 and breath sounds checked- equal and bilateral Secured at: 21 cm Tube secured with: Tape Dental Injury: Teeth and Oropharynx as per pre-operative assessment

## 2020-03-20 NOTE — Anesthesia Procedure Notes (Signed)
Anesthesia Regional Block: Interscalene brachial plexus block   Pre-Anesthetic Checklist: ,, timeout performed, Correct Patient, Correct Site, Correct Laterality, Correct Procedure, Correct Position, site marked, Risks and benefits discussed,  Surgical consent,  Pre-op evaluation,  At surgeon's request and post-op pain management  Laterality: Right and Upper  Prep: chloraprep       Needles:  Injection technique: Single-shot  Needle Type: Echogenic Stimulator Needle     Needle Length: 10cm  Needle Gauge: 21   Needle insertion depth: 1 cm   Additional Needles:   Procedures:,,,, ultrasound used (permanent image in chart),,,,  Narrative:  Start time: 03/20/2020 8:50 AM End time: 03/20/2020 9:00 AM Injection made incrementally with aspirations every 5 mL.  Performed by: Personally  Anesthesiologist: Leilani Able, MD

## 2020-03-20 NOTE — Anesthesia Postprocedure Evaluation (Signed)
Anesthesia Post Note  Patient: Felicia [name]  Procedure(s) Performed: REVERSE SHOULDER ARTHROPLASTY (Right Shoulder)     Patient location during evaluation: PACU Anesthesia Type: General Level of consciousness: awake Pain management: pain level controlled Vital Signs Assessment: post-procedure vital signs reviewed and stable Respiratory status: spontaneous breathing Cardiovascular status: stable Postop Assessment: no apparent nausea or vomiting Anesthetic complications: no    Last Vitals:  Vitals:   03/20/20 1230 03/20/20 1245  BP: (!) 145/81   Pulse: 81   Resp: 18   Temp:  36.6 C  SpO2: 96%     Last Pain:  Vitals:   03/20/20 1245  TempSrc:   PainSc: 0-No pain   Pain Goal: Patients Stated Pain Goal: 4 (03/20/20 0802)                 Caren Macadam

## 2020-03-20 NOTE — Discharge Instructions (Signed)
 Kevin M. Supple, M.D., F.A.A.O.S. Orthopaedic Surgery Specializing in Arthroscopic and Reconstructive Surgery of the Shoulder [phone] [address] - [place], [place] [address] - Fax [phone]   POST-OP TOTAL SHOULDER REPLACEMENT INSTRUCTIONS  1. Follow up in the office for your first post-op appointment 10-14 days from the date of your surgery. If you do not already have a scheduled appointment, our office will contact you to schedule.  2. The bandage over your incision is waterproof. You may begin showering with this dressing on. You may leave this dressing on until first follow up appointment within 2 weeks. We prefer you leave this dressing in place until follow up however after 5-7 days if you are having itching or skin irritation and would like to remove it you may do so. Go slow and tug at the borders gently to break the bond the dressing has with the skin. At this point if there is no drainage it is okay to go without a bandage or you may cover it with a light guaze and tape. You can also expect significant bruising around your shoulder that will drift down your arm and into your chest wall. This is very normal and should resolve over several days.   3. Wear your sling/immobilizer at all times except to perform the exercises below or to occasionally let your arm dangle by your side to stretch your elbow. You also need to sleep in your sling immobilizer until instructed otherwise. It is ok to remove your sling if you are sitting in a controlled environment and allow your arm to rest in a position of comfort by your side or on your lap with pillows to give your neck and skin a break from the sling. You may remove it to allow arm to dangle by side to shower. If you are up walking around and when you go to sleep at night you need to wear it.  4. Range of motion to your elbow, wrist, and hand are encouraged 3-5 times daily. Exercise to your hand and fingers helps to reduce  swelling you may experience.   5. Prescriptions for a pain medication and a muscle relaxant are provided for you. It is recommended that if you are experiencing pain that you pain medication alone is not controlling, add the muscle relaxant along with the pain medication which can give additional pain relief. The first 1-2 days is generally the most severe of your pain and then should gradually decrease. As your pain lessens it is recommended that you decrease your use of the pain medications to an "as needed basis'" only and to always comply with the recommended dosages of the pain medications.  6. Pain medications can produce constipation along with their use. If you experience this, the use of an over the counter stool softener or laxative daily is recommended.   7. For additional questions or concerns, please do not hesitate to call the office. If after hours there is an answering service to forward your concerns to the physician on call.  8.Pain control following an exparel block  To help control your post-operative pain you received a nerve block  performed with Exparel which is a long acting anesthetic (numbing agent) which can provide pain relief and sensations of numbness (and relief of pain) in the operative shoulder and arm for up to 3 days. Sometimes it provides mixed relief, meaning you may still have numbness in certain areas of the arm but can still be able to   move  parts of that arm, hand, and fingers. We recommend that your prescribed pain medications  be used as needed. We do not feel it is necessary to "pre medicate" and "stay ahead" of pain.  Taking narcotic pain medications when you are not having any pain can lead to unnecessary and potentially dangerous side effects.    9. Use the ice machine as much as possible in the first 5-7 days from surgery, then you can wean its use to as needed. The ice typically needs to be replaced every 6 hours, instead of ice you can actually freeze  water bottles to put in the cooler and then fill water around them to avoid having to purchase ice. You can have spare water bottles freezing to allow you to rotate them once they have melted. Try to have a thin shirt or light cloth or towel under the ice wrap to protect your skin.   10.  We recommend that you avoid any dental work or cleaning in the first 3 months following your joint replacement. This is to help minimize the possibility of infection from the bacteria in your mouth that enters your bloodstream during dental work. We also recommend that you take an antibiotic prior to your dental work for the first year after your shoulder replacement to further help reduce that risk. Please simply contact our office for antibiotics to be sent to your pharmacy prior to dental work.  POST-OP EXERCISES  Pendulum Exercises  Perform pendulum exercises while standing and bending at the waist. Support your uninvolved arm on a table or chair and allow your operated arm to hang freely. Make sure to do these exercises passively - not using you shoulder muscles. These exercises can be performed once your nerve block effects have worn off.  Repeat 20 times. Do 3 sessions per day.     

## 2020-03-20 NOTE — Transfer of Care (Signed)
Immediate Anesthesia Transfer of Care Note  Patient: Felicia [name]  Procedure(s) Performed: REVERSE SHOULDER ARTHROPLASTY (Right Shoulder)  Patient Location: PACU  Anesthesia Type:General  Level of Consciousness: awake, alert  and patient cooperative  Airway & Oxygen Therapy: Patient Spontanous Breathing and Patient connected to face mask oxygen  Post-op Assessment: Report given to RN and Post -op Vital signs reviewed and stable  Post vital signs: Reviewed and stable  Last Vitals:  Vitals Value Taken Time  BP 137/94 03/20/20 1145  Temp    Pulse 73 03/20/20 1146  Resp 16 03/20/20 1146  SpO2 100 % 03/20/20 1146  Vitals shown include unvalidated device data.  Last Pain:  Vitals:   03/20/20 0843  TempSrc:   PainSc: 4       Patients Stated Pain Goal: 4 (03/20/20 0802)  Complications: No apparent anesthesia complications

## 2020-03-21 ENCOUNTER — Encounter: Payer: Self-pay | Admitting: *Deleted

## 2020-03-21 DIAGNOSIS — M75101 Unspecified rotator cuff tear or rupture of right shoulder, not specified as traumatic: Secondary | ICD-10-CM | POA: Diagnosis not present

## 2020-03-21 MED ORDER — ONDANSETRON HCL 4 MG PO TABS: 4.0000 mg | ORAL_TABLET | Freq: Three times a day (TID) | ORAL | 0 refills | Status: AC | PRN

## 2020-03-21 MED ORDER — OXYCODONE-ACETAMINOPHEN 5-325 MG PO TABS: 1.0000 | ORAL_TABLET | ORAL | 0 refills | Status: AC | PRN

## 2020-03-21 MED ORDER — CYCLOBENZAPRINE HCL 10 MG PO TABS: 5.0000 mg | ORAL_TABLET | Freq: Three times a day (TID) | ORAL | 1 refills | Status: AC | PRN

## 2020-03-21 MED ORDER — TRAMADOL HCL 50 MG PO TABS: 50.0000 mg | ORAL_TABLET | Freq: Four times a day (QID) | ORAL | 0 refills | Status: AC | PRN

## 2020-03-21 NOTE — Progress Notes (Signed)
Patient being dc in stable condition and mild pain. Call bell within reach. Patient discharge questions addressed appropriatly with patient and patient sister.

## 2020-03-21 NOTE — Plan of Care (Signed)

## 2020-03-21 NOTE — Evaluation (Signed)
Occupational Therapy Evaluation Patient Details Name: Felicia [name] MRN: [id number] DOB: 12-Aug-1951 Today's Date: 03/21/2020    History of Present Illness Patient is a 69 year old female admitted for right reverse total shoulder arthroplasty.   Clinical Impression   Patient educated on shoulder protocol, prescribed exercises and compensatory strategies for self care. Patient verbalize/demo understanding. All questions/concerns addressed.    Follow Up Recommendations  Follow surgeon's recommendation for DC plan and follow-up therapies    Equipment Recommendations  None recommended by OT       Precautions / Restrictions Precautions Precautions: Shoulder Type of Shoulder Precautions: PROM for ADL ER 20 ABD 45 FF 60,  AROM elbow wrist hand, pendulums and lap slides, sling off controlled environment Shoulder Interventions: Shoulder sling/immobilizer;Off for dressing/bathing/exercises Precaution Booklet Issued: Yes (comment) Required Braces or Orthoses: Sling Restrictions Weight Bearing Restrictions: Yes RUE Weight Bearing: Non weight bearing      Mobility Bed Mobility               General bed mobility comments: in chair  Transfers Overall transfer level: Needs assistance Equipment used: None Transfers: Sit to/from Stand Sit to Stand: Supervision              Balance Overall balance assessment: Mild deficits observed, not formally tested                                         ADL either performed or assessed with clinical judgement   ADL Overall ADL's : Needs assistance/impaired     Grooming: Set up;Sitting   Upper Body Bathing: Minimal assistance;Sitting   Lower Body Bathing: Supervison/ safety;Sitting/lateral leans;Sit to/from stand   Upper Body Dressing : Moderate assistance;Cueing for compensatory techniques;Sitting   Lower Body Dressing: Supervision/safety;Sit to/from stand   Toilet Transfer: Supervision/safety;Ambulation    Toileting- Clothing Manipulation and Hygiene: Supervision/safety;Sitting/lateral lean;Sit to/from stand       Functional mobility during ADLs: Supervision/safety                    Pertinent Vitals/Pain Pain Assessment: No/denies pain     Hand Dominance Right   Extremity/Trunk Assessment Upper Extremity Assessment Upper Extremity Assessment: RUE deficits/detail RUE Deficits / Details: intact AROM hand and wrist, elbow flexion limited to approx 60 degrees   Lower Extremity Assessment Lower Extremity Assessment: Overall WFL for tasks assessed   Cervical / Trunk Assessment Cervical / Trunk Assessment: Normal   Communication Communication Communication: No difficulties   Cognition Arousal/Alertness: Awake/alert Behavior During Therapy: WFL for tasks assessed/performed Overall Cognitive Status: Within Functional Limits for tasks assessed                                     General Comments       Exercises Exercises: Shoulder Shoulder Exercises Elbow Flexion: 5 reps;AAROM;Seated;Right Elbow Extension: AAROM;Right;5 reps;Seated Wrist Flexion: AROM;Right;5 reps;Seated Wrist Extension: AROM;Right;5 reps;Seated Digit Composite Flexion: AROM;Right;5 reps;Seated Composite Extension: AROM;Right;5 reps;Seated Neck Flexion: AROM;5 reps;Seated Neck Extension: AROM;5 reps;Seated Neck Lateral Flexion - Right: AROM;5 reps;Seated Neck Lateral Flexion - Left: AROM;5 reps;Seated   Shoulder Instructions Shoulder Instructions Donning/doffing shirt without moving shoulder: Moderate assistance;Patient able to independently direct caregiver Method for sponge bathing under operated UE: Patient able to independently direct caregiver;Supervision/safety Donning/doffing sling/immobilizer: Patient able to independently direct caregiver Correct positioning of sling/immobilizer:  Independent;Patient able to independently direct caregiver Pendulum exercises (written home  exercise program): Patient able to independently direct caregiver ROM for elbow, wrist and digits of operated UE: Independent;Patient able to independently direct caregiver Sling wearing schedule (on at all times/off for ADL's): Independent;Patient able to independently direct caregiver Proper positioning of operated UE when showering: Independent;Patient able to independently direct caregiver Positioning of UE while sleeping: Patient able to independently direct caregiver    Home Living Family/patient expects to be discharged to:: Private residence Living Arrangements: Parent;Other (Comment)(sister) Available Help at Discharge: Family;Available 24 hours/day Type of Home: Other(Comment)(townhouse)       Home Layout: Bed/bath upstairs;Other (Comment)(has chair lift)     Bathroom Shower/Tub: Tub/shower unit         Home Equipment: Grab bars - tub/shower          Prior Functioning/Environment Level of Independence: Independent                 OT Problem List: Impaired UE functional use;Pain         OT Goals(Current goals can be found in the care plan section) Acute Rehab OT Goals Patient Stated Goal: to go home OT Goal Formulation: With patient Time For Goal Achievement: 04/04/20 Potential to Achieve Goals: Good   AM-PAC OT "6 Clicks" Daily Activity     Outcome Measure Help from another person eating meals?: A Little Help from another person taking care of personal grooming?: A Little Help from another person toileting, which includes using toliet, bedpan, or urinal?: A Little Help from another person bathing (including washing, rinsing, drying)?: A Little Help from another person to put on and taking off regular upper body clothing?: A Lot Help from another person to put on and taking off regular lower body clothing?: A Little 6 Click Score: 17   End of Session    Activity Tolerance: Patient tolerated treatment well Patient left: in chair;with call bell/phone  within reach  OT Visit Diagnosis: Pain Pain - Right/Left: Right Pain - part of body: Shoulder                Time: 0950-1030 OT Time Calculation (min): 40 min Charges:  OT General Charges $OT Visit: 1 Visit OT Evaluation $OT Eval Moderate Complexity: 1 Mod  Delbert Phenix OT Pager: [place] 03/21/2020, 11:19 AM

## 2020-03-21 NOTE — Discharge Summary (Signed)
PATIENT ID:      Felicia [name]  MRN:     [id number] DOB/AGE:    Jan 31, 1951 / 69 y.o.     DISCHARGE SUMMARY  ADMISSION DATE:    03/20/2020 DISCHARGE DATE:    ADMISSION DIAGNOSIS: Right shoulder rotator cuff tear arthropathy Past Medical History:  Diagnosis Date  . Anemia   . Asthma   . Coronary artery disease   . Dyspnea    with alergies  . Hypertension   . Hypothyroidism   . Lichen sclerosus    vulva  . Menopause   . Polyp of colon 04/2006   benign     DISCHARGE DIAGNOSIS:   Active Problems:   S/P reverse total shoulder arthroplasty, right   PROCEDURE: Procedure(s): REVERSE SHOULDER ARTHROPLASTY on 03/20/2020  CONSULTS:    HISTORY:  See H&P in chart.  HOSPITAL COURSE:  Felicia [name] is a 69 y.o. admitted on 03/20/2020 with a diagnosis of Right shoulder rotator cuff tear arthropathy.  They were brought to the operating room on 03/20/2020 and underwent Procedure(s): REVERSE SHOULDER ARTHROPLASTY.    They were given perioperative antibiotics:  Anti-infectives (From admission, onward)   Start     Dose/Rate Route Frequency Ordered Stop   03/20/20 0800  ceFAZolin (ANCEF) IVPB 2g/100 mL premix     2 g 200 mL/hr over 30 Minutes Intravenous On call to O.R. 03/20/20 0751 03/20/20 1014   03/20/20 0754  ceFAZolin (ANCEF) 2-4 GM/100ML-% IVPB    Note to Pharmacy: Vevelyn Royals  : cabinet override      03/20/20 0754 03/20/20 1025    .  Patient underwent the above named procedure and tolerated it well. The following day they were hemodynamically stable and pain was controlled on oral analgesics. They were neurovascularly intact to the operative extremity. OT was ordered and worked with patient per protocol. They were medically and orthopaedically stable for discharge on .    DIAGNOSTIC STUDIES:  RECENT RADIOGRAPHIC STUDIES :  No results found.  RECENT VITAL SIGNS:   Patient Vitals for the past 24 hrs:  BP Temp Temp src Pulse Resp SpO2  03/21/20 0542 127/83 98.4 F (36.9 C)  Oral 78 16 100 %  03/21/20 0129 120/81 98.5 F (36.9 C) Oral 90 16 100 %  03/20/20 2110 133/84 98.4 F (36.9 C) Oral 99 18 96 %  03/20/20 1615 124/78 97.7 F (36.5 C) Oral 98 16 100 %  03/20/20 1515 122/84 -- -- 90 16 100 %  03/20/20 1411 130/80 -- -- 80 16 99 %  03/20/20 1311 (!) 146/83 97.8 F (36.6 C) -- 75 14 100 %  03/20/20 1245 -- 97.9 F (36.6 C) -- -- -- --  03/20/20 1230 (!) 145/81 -- -- 81 18 96 %  03/20/20 1215 (!) 143/98 -- -- 70 (!) 23 99 %  03/20/20 1200 (!) 145/74 -- -- 69 (!) 21 100 %  03/20/20 1145 (!) 137/94 97.7 F (36.5 C) -- -- -- --  03/20/20 0857 -- -- -- 79 10 99 %  03/20/20 0854 -- -- -- 74 11 98 %  03/20/20 0851 -- -- -- 73 10 98 %  03/20/20 0848 -- -- -- 75 14 97 %  03/20/20 0845 (!) 156/84 -- -- 77 11 100 %  03/20/20 0843 -- -- -- -- -- 100 %  03/20/20 0809 (!) 149/82 98.8 F (37.1 C) Oral 83 14 99 %  .  RECENT EKG RESULTS:    Orders placed or performed during  the hospital encounter of 03/10/20  . EKG 12 lead  . EKG 12 lead    DISCHARGE INSTRUCTIONS:    DISCHARGE MEDICATIONS:   Allergies as of 03/21/2020      Reactions   Iodine Rash      Medication List    TAKE these medications   amLODipine 5 MG tablet Commonly known as: NORVASC Take 5 mg by mouth daily.   cyclobenzaprine 10 MG tablet Commonly known as: FLEXERIL Take 0.5-1 tablets (5-10 mg total) by mouth 3 (three) times daily as needed for muscle spasms.   levothyroxine 125 MCG tablet Commonly known as: SYNTHROID Take 125 mcg by mouth daily.   Meclizine HCl 25 MG Chew Chew 25 mg by mouth every 6 (six) hours as needed for nausea or dizziness.   ondansetron 4 MG tablet Commonly known as: Zofran Take 1 tablet (4 mg total) by mouth every 8 (eight) hours as needed for nausea or vomiting.   OVER THE COUNTER MEDICATION Take 1 tablet by mouth daily. Hydroxycut   oxyCODONE-acetaminophen 5-325 MG tablet Commonly known as: Percocet Take 1 tablet by mouth every 4 (four) hours as  needed for severe pain (max 6 q).   Potassium 99 MG Tabs Take 99 mg by mouth daily.   traMADol 50 MG tablet Commonly known as: ULTRAM Take 50 mg by mouth 3 (three) times daily as needed for pain. What changed: Another medication with the same name was added. Make sure you understand how and when to take each.   traMADol 50 MG tablet Commonly known as: ULTRAM Take 1 tablet (50 mg total) by mouth every 6 (six) hours as needed for moderate pain. What changed: You were already taking a medication with the same name, and this prescription was added. Make sure you understand how and when to take each.   vitamin C 1000 MG tablet Take 1,000 mg by mouth daily.   Vitamin D 125 MCG (5000 UT) Caps Take 5,000 Units by mouth daily.   vitamin E 180 MG (400 UNITS) capsule Take 400 Units by mouth daily.       FOLLOW UP VISIT:   Follow-up Information    Justice Britain, MD.   Specialty: Orthopedic Surgery Why: in 10-14 days Contact information: [address] [place] [address] [phone]           DISCHARGE ZY:SAYT   DISCHARGE CONDITION:  Thereasa Parkin Kooper [name] for Dr. Justice Britain 03/21/2020, 8:06 AM

## 2023-09-24 LAB — COLOGUARD: COLOGUARD: POSITIVE — AB

## 2024-08-18 ENCOUNTER — Other Ambulatory Visit: Payer: Self-pay

## 2024-08-18 ENCOUNTER — Encounter (HOSPITAL_BASED_OUTPATIENT_CLINIC_OR_DEPARTMENT_OTHER): Payer: Self-pay | Admitting: Emergency Medicine

## 2024-08-18 ENCOUNTER — Emergency Department (HOSPITAL_BASED_OUTPATIENT_CLINIC_OR_DEPARTMENT_OTHER)
Admission: EM | Admit: 2024-08-18 | Discharge: 2024-08-18 | Disposition: A | Discharge status: Home / Self Care | Attending: Emergency Medicine | Admitting: Emergency Medicine

## 2024-08-18 ENCOUNTER — Emergency Department (HOSPITAL_BASED_OUTPATIENT_CLINIC_OR_DEPARTMENT_OTHER)

## 2024-08-18 DIAGNOSIS — I2510 Atherosclerotic heart disease of native coronary artery without angina pectoris: Secondary | ICD-10-CM | POA: Diagnosis not present

## 2024-08-18 DIAGNOSIS — Z79890 Hormone replacement therapy (postmenopausal): Secondary | ICD-10-CM | POA: Diagnosis not present

## 2024-08-18 DIAGNOSIS — K2970 Gastritis, unspecified, without bleeding: Secondary | ICD-10-CM | POA: Diagnosis not present

## 2024-08-18 DIAGNOSIS — K760 Fatty (change of) liver, not elsewhere classified: Secondary | ICD-10-CM | POA: Diagnosis not present

## 2024-08-18 DIAGNOSIS — R1013 Epigastric pain: Secondary | ICD-10-CM | POA: Diagnosis present

## 2024-08-18 DIAGNOSIS — E039 Hypothyroidism, unspecified: Secondary | ICD-10-CM | POA: Insufficient documentation

## 2024-08-18 DIAGNOSIS — J45909 Unspecified asthma, uncomplicated: Secondary | ICD-10-CM | POA: Diagnosis not present

## 2024-08-18 DIAGNOSIS — I10 Essential (primary) hypertension: Secondary | ICD-10-CM | POA: Insufficient documentation

## 2024-08-18 DIAGNOSIS — Z79899 Other long term (current) drug therapy: Secondary | ICD-10-CM | POA: Insufficient documentation

## 2024-08-18 DIAGNOSIS — Z87891 Personal history of nicotine dependence: Secondary | ICD-10-CM | POA: Diagnosis not present

## 2024-08-18 LAB — CBC WITH DIFFERENTIAL/PLATELET
Abs Immature Granulocytes: 0.04 K/uL (ref 0.00–0.07)
Basophils Absolute: 0 K/uL (ref 0.0–0.1)
Basophils Relative: 0 %
Eosinophils Absolute: 0.3 K/uL (ref 0.0–0.5)
Eosinophils Relative: 3 %
HCT: 35.6 % — ABNORMAL LOW (ref 36.0–46.0)
Hemoglobin: 12.4 g/dL (ref 12.0–15.0)
Immature Granulocytes: 0 %
Lymphocytes Relative: 14 %
Lymphs Abs: 1.4 K/uL (ref 0.7–4.0)
MCH: 33.1 pg (ref 26.0–34.0)
MCHC: 34.8 g/dL (ref 30.0–36.0)
MCV: 94.9 fL (ref 80.0–100.0)
Monocytes Absolute: 1.1 K/uL — ABNORMAL HIGH (ref 0.1–1.0)
Monocytes Relative: 11 %
Neutro Abs: 7.2 K/uL (ref 1.7–7.7)
Neutrophils Relative %: 72 %
Platelets: 184 K/uL (ref 150–400)
RBC: 3.75 MIL/uL — ABNORMAL LOW (ref 3.87–5.11)
RDW: 11.5 % (ref 11.5–15.5)
WBC: 10.1 K/uL (ref 4.0–10.5)
nRBC: 0 % (ref 0.0–0.2)

## 2024-08-18 LAB — COMPREHENSIVE METABOLIC PANEL WITH GFR
ALT: 27 U/L (ref 0–44)
AST: 22 U/L (ref 15–41)
Albumin: 3.8 g/dL (ref 3.5–5.0)
Alkaline Phosphatase: 67 U/L (ref 38–126)
Anion gap: 14 (ref 5–15)
BUN: 12 mg/dL (ref 8–23)
CO2: 28 mmol/L (ref 22–32)
Calcium: 10.3 mg/dL (ref 8.9–10.3)
Chloride: 89 mmol/L — ABNORMAL LOW (ref 98–111)
Creatinine, Ser: 0.9 mg/dL (ref 0.44–1.00)
GFR, Estimated: >60 mL/min (ref >60)
Glucose, Bld: 161 mg/dL — ABNORMAL HIGH (ref 70–99)
Potassium: 3.4 mmol/L — ABNORMAL LOW (ref 3.5–5.1)
Sodium: 132 mmol/L — ABNORMAL LOW (ref 135–145)
Total Bilirubin: 0.3 mg/dL (ref 0.0–1.2)
Total Protein: 6.8 g/dL (ref 6.5–8.1)

## 2024-08-18 LAB — URINALYSIS, MICROSCOPIC (REFLEX)

## 2024-08-18 LAB — URINALYSIS, ROUTINE W REFLEX MICROSCOPIC
Bilirubin Urine: NEGATIVE
Glucose, UA: NEGATIVE mg/dL
Hgb urine dipstick: NEGATIVE
Ketones, ur: NEGATIVE mg/dL
Leukocytes,Ua: NEGATIVE
Nitrite: NEGATIVE
Protein, ur: 100 mg/dL — AB
Specific Gravity, Urine: 1.02 (ref 1.005–1.030)
pH: 8.5 — ABNORMAL HIGH (ref 5.0–8.0)

## 2024-08-18 LAB — LIPASE, BLOOD: Lipase: 36 U/L (ref 11–51)

## 2024-08-18 MED ORDER — PANTOPRAZOLE SODIUM 40 MG PO TBEC: 40.0000 mg | DELAYED_RELEASE_TABLET | Freq: Every day | ORAL | 0 refills | Status: AC

## 2024-08-18 MED ORDER — SUCRALFATE 1 G PO TABS: 1.0000 g | ORAL_TABLET | Freq: Three times a day (TID) | ORAL | 0 refills | Status: AC

## 2024-08-18 NOTE — ED Triage Notes (Signed)
 Pt c/o RUQ pain since yesterday; denies NVD; c/o dizziness since yesterday as well

## 2024-08-18 NOTE — ED Provider Notes (Signed)
 [place] [facility] Provider Note  CSN: [id number] Arrival date & time: 08/18/24 1745  Chief Complaint(s) Abdominal Pain  HPI Felicia [name] is a 73 y.o. female with past medical history as below, significant for asthma, CAD, hypertension, dyspepsia who presents to the ED with complaint of abdominal pain  Patient reports she began having some epigastric pain earlier today, she attempted to take Alka-Seltzer but it made her nauseated and she vomited x 1.  Nonbloody nonbilious.  Pain described as burning sensation, felt like indigestion.  Right upper quadrant epigastrium.  Denies similar symptoms in the past.  Denies recent diet or medication changes.  Patient does not feel as though the pain is provoked by p.o. intake.  No excessive use of NSAIDs.  No blood in stool or blood in vomitus.  No fevers or chills.  No frequent alcohol use.  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 Authorizing Provider  pantoprazole  (PROTONIX ) 40 MG tablet Take 1 tablet (40 mg total) by mouth daily for 14 days. 08/18/24 09/01/24 Yes Elnor Savant A, DO  sucralfate (CARAFATE) 1 g tablet Take 1 tablet (1 g total) by mouth with breakfast, with lunch, and with evening meal  for 7 days. 08/18/24 08/25/24 Yes Elnor Savant LABOR, DO  amLODipine  (NORVASC ) 5 MG tablet Take 5 mg by mouth daily.  12/26/19   [provider]  Ascorbic Acid (VITAMIN C) 1000 MG tablet Take 1,000 mg by mouth daily.    [provider]  Cholecalciferol (VITAMIN D) 125 MCG (5000 UT) CAPS Take 5,000 Units by mouth daily.    [provider]  cyclobenzaprine  (FLEXERIL ) 10 MG tablet Take 0.5-1 tablets (5-10 mg total) by mouth 3 (three) times daily as needed for muscle spasms. 03/21/20   Shuford, Randine, PA-C  levothyroxine  (SYNTHROID ) 125 MCG tablet Take 125 mcg by mouth daily. 03/12/20   [provider]  Meclizine HCl 25 MG CHEW Chew 25 mg by mouth every 6 (six) hours as needed for nausea or dizziness. 03/11/20   [provider]  ondansetron  (ZOFRAN ) 4 MG tablet Take 1 tablet (4 mg total) by mouth every 8 (eight) hours as needed for nausea or vomiting. 03/21/20   Shuford, Randine, PA-C  OVER THE COUNTER MEDICATION Take 1 tablet by mouth daily. Hydroxycut    [provider]  oxyCODONE -acetaminophen  (PERCOCET) 5-325 MG tablet Take 1 tablet by mouth every 4 (four) hours as needed for severe pain (max 6 q). 03/21/20   Shuford, Randine, PA-C  Potassium 99 MG TABS Take 99 mg by mouth daily.    [provider]  traMADol  (ULTRAM ) 50 MG tablet Take 50 mg by mouth 3 (three)  times daily as needed for pain. 12/26/19   [provider]  traMADol  (ULTRAM ) 50 MG tablet Take 1 tablet (50 mg total) by mouth every 6 (six) hours as needed for moderate pain. 03/21/20   Shuford, Randine, PA-C  vitamin E 180 MG (400 UNITS) capsule Take 400 Units by mouth daily.    [provider]                                                                                                                                    Past Surgical History Past Surgical History:  Procedure Laterality Date   BREAST SURGERY  11/15/2001   left breast biopsy    CARPAL TUNNEL RELEASE     LEFT    CESAREAN SECTION     COLONOSCOPY W/ BIOPSIES  03/15/2010   REVERSE SHOULDER ARTHROPLASTY Right 03/20/2020   Procedure: REVERSE SHOULDER ARTHROPLASTY;  Surgeon: Melita Drivers, MD;  Location: [facility];  Service: Orthopedics;  Laterality: Right;    SHOULDER SURGERY Right    Family History Family History  Problem Relation Age of Onset   Cancer Maternal Grandmother        STOMACH   Cancer Paternal Grandmother        STOMACH   Osteoporosis Mother    Osteoporosis Sister    Cancer Maternal Grandfather        LIVER    Social History Social History   Tobacco Use   Smoking status: Former    Current packs/day: 0.00    Types: Cigarettes    Quit date: 07/14/2012    Years since quitting: 12.1   Smokeless tobacco: Never  Vaping Use   Vaping status: Never Used  Substance Use Topics   Alcohol use: Yes    Comment: SOCIAL   Drug use: No   Allergies Iodinated contrast media and Iodine  Review of Systems A thorough review of systems was obtained and all systems are negative except as noted in the HPI and PMH.   Physical Exam Vital Signs  I have reviewed the triage vital signs BP (!) 158/102   Pulse 88   Temp 98 F (36.7 C) (Oral)   Resp 18   Ht 5' 1 (1.549 m)   Wt 70.8 kg   SpO2 100%   BMI 29.48 kg/m  Physical Exam Vitals and nursing note reviewed.  Constitutional:      General: She is not in acute distress.    Appearance: Normal appearance.  HENT:     Head: Normocephalic and atraumatic.     Right Ear: External ear normal.     Left Ear: External ear normal.     Nose: Nose normal.     Mouth/Throat:     Mouth: Mucous membranes are moist.  Eyes:     General: No scleral icterus.       Right eye: No discharge.        Left eye:  No discharge.  Cardiovascular:     Rate and Rhythm: Normal rate and regular rhythm.     Pulses: Normal pulses.     Heart sounds: Normal heart sounds.  Pulmonary:     Effort: Pulmonary effort is normal. No respiratory distress.     Breath  sounds: Normal breath sounds. No stridor.  Abdominal:     General: Abdomen is flat. There is no distension.     Palpations: Abdomen is soft.     Tenderness: There is no abdominal tenderness.  Musculoskeletal:     Cervical back: No rigidity.     Right lower leg: No edema.     Left lower leg: No edema.  Skin:    General: Skin is warm and dry.     Capillary Refill: Capillary refill takes less than 2 seconds.  Neurological:     Mental Status: She is alert.  Psychiatric:        Mood and Affect: Mood normal.        Behavior: Behavior normal. Behavior is cooperative.     ED Results and Treatments Labs (all labs ordered are listed, but only abnormal results are displayed) Labs Reviewed  COMPREHENSIVE METABOLIC PANEL WITH GFR - Abnormal; Notable for the following components:      Result Value   Sodium 132 (*)    Potassium 3.4 (*)    Chloride 89 (*)    Glucose, Bld 161 (*)    All other components within normal limits  URINALYSIS, ROUTINE W REFLEX MICROSCOPIC - Abnormal; Notable for the following components:   APPearance CLOUDY (*)    pH 8.5 (*)    Protein, ur 100 (*)    All other components within normal limits  CBC WITH DIFFERENTIAL/PLATELET - Abnormal; Notable for the following components:   RBC 3.75 (*)    HCT 35.6 (*)    Monocytes Absolute 1.1 (*)    All other components within normal limits  URINALYSIS, MICROSCOPIC (REFLEX) - Abnormal; Notable for the following components:   Bacteria, UA FEW (*)    All other components within normal limits  LIPASE, BLOOD                                                                                                                          Radiology US  Abdomen Limited RUQ (LIVER/GB) Result Date: 08/18/2024 CLINICAL DATA:  Right upper quadrant pain. EXAM: ULTRASOUND ABDOMEN LIMITED RIGHT UPPER QUADRANT COMPARISON:  None Available. FINDINGS: Gallbladder: Physiologically distended. No gallstones or wall thickening visualized. No sonographic  Murphy sign noted by sonographer. Common bile duct: Diameter: 6 mm, normal. Liver: No focal lesion identified. Borderline increased in parenchymal echogenicity. Portal vein is patent on color Doppler imaging with normal direction of blood flow towards the liver. Other: No right upper quadrant ascites. IMPRESSION: 1. Normal sonographic appearance of the gallbladder and biliary tree. 2. Borderline hepatic steatosis. Electronically Signed   By: Andrea Gasman M.D.   On: 08/18/2024 20:00  Pertinent labs & imaging results that were available during my care of the patient were reviewed by me and considered in my medical decision making (see MDM for details).  Medications Ordered in ED Medications - No data to display                                                                                                                                   Procedures Procedures  (including critical care time)  Medical Decision Making / ED Course    Medical Decision Making:    ANGELLY SPEARING is a 73 y.o. female with past medical history as below, significant for asthma, CAD, hypertension, dyspepsia who presents to the ED with complaint of abdominal pain. The complaint involves an extensive differential diagnosis and also carries with it a high risk of complications and morbidity.  Serious etiology was considered. Ddx includes but is not limited to: Differential diagnosis includes but is not exclusive to acute cholecystitis, intrathoracic causes for epigastric abdominal pain, gastritis, duodenitis, pancreatitis, small bowel or large bowel obstruction, abdominal aortic aneurysm, hernia, gastritis, etc.   Complete initial physical exam performed, notably the patient was in NAD, pain resolved.    Reviewed and confirmed nursing documentation for past medical history, family history, social history.  Vital signs reviewed.    Epigastric/RUQ pain  Gastritis > - Patient with right upper quadrant epigastric  abdominal pain, burning, feeling indigestion.  Symptoms have since resolved. - Patient denies any blood in her stool or blood in her vomitus.  No frequent NSAID use or frequent alcohol use. - Upon my assessment patient's symptoms have resolved. - Labs are reassuring, liver enzymes and bilirubin are stable. - Right upper quadrant ultrasound is reassuring, does show borderline hepatic steatosis  Favor gastritis, start on PPI, Carafate, bland diet, follow-up with GI.  No evidence of bleeding  Abdominal pain precautions provided      11:47 PM: symptoms resolved, tolerating po, hds, abd benign, workup benign. I have discussed the diagnosis/risks/treatment options with the patient and friend.  Evaluation and diagnostic testing in the emergency department does not suggest an emergent condition requiring admission or immediate intervention beyond what has been performed at this time.  They will follow up with pcp, gi. We also discussed returning to the ED immediately if new or worsening sx occur. We discussed the sx which are most concerning (e.g., sudden worsening pain, fever, inability to tolerate by mouth , yellow skin, blood in stool/vomit) that necessitate immediate return.    The patient appears reasonably screened and/or stabilized for discharge and I doubt any other medical condition or other [facility] requiring further screening, evaluation, or treatment in the ED at this time prior to discharge.                  Additional history obtained: -Additional history obtained from friend -External records from outside source obtained and reviewed including: Chart review including previous  notes, labs, imaging, consultation notes including  Primary care documentation   Lab Tests: -I ordered, reviewed, and interpreted labs.   The pertinent results include:   Labs Reviewed  COMPREHENSIVE METABOLIC PANEL WITH GFR - Abnormal; Notable for the following components:      Result Value    Sodium 132 (*)    Potassium 3.4 (*)    Chloride 89 (*)    Glucose, Bld 161 (*)    All other components within normal limits  URINALYSIS, ROUTINE W REFLEX MICROSCOPIC - Abnormal; Notable for the following components:   APPearance CLOUDY (*)    pH 8.5 (*)    Protein, ur 100 (*)    All other components within normal limits  CBC WITH DIFFERENTIAL/PLATELET - Abnormal; Notable for the following components:   RBC 3.75 (*)    HCT 35.6 (*)    Monocytes Absolute 1.1 (*)    All other components within normal limits  URINALYSIS, MICROSCOPIC (REFLEX) - Abnormal; Notable for the following components:   Bacteria, UA FEW (*)    All other components within normal limits  LIPASE, BLOOD    Notable for labs are stable  EKG   EKG Interpretation Date/Time:  Saturday August 18 2024 18:09:38 EDT Ventricular Rate:  81 PR Interval:  162 QRS Duration:  104 QT Interval:  462 QTC Calculation: 537 R Axis:   2  Text Interpretation: Sinus rhythm Probable left atrial enlargement Probable anterior infarct, age indeterminate Prolonged QT interval Confirmed by Elnor Savant ([id number]) on 08/18/2024 8:29:28 PM         Imaging Studies ordered: I ordered imaging studies including right upper quadrant ultrasound I independently visualized the following imaging with scope of interpretation limited to determining acute life threatening conditions related to emergency care; findings noted above I agree with the radiologist interpretation If any imaging was obtained with contrast I closely monitored patient for any possible adverse reaction a/w contrast administration in the emergency department   Medicines ordered and prescription drug management: Meds ordered this encounter  Medications   pantoprazole  (PROTONIX ) 40 MG tablet    Sig: Take 1 tablet (40 mg total) by mouth daily for 14 days.    Dispense:  14 tablet    Refill:  0   sucralfate (CARAFATE) 1 g tablet    Sig: Take 1 tablet (1 g total) by mouth with  breakfast, with lunch, and with evening meal for 7 days.    Dispense:  21 tablet    Refill:  0    -I have reviewed the patients home medicines and have made adjustments as needed   Consultations Obtained: Not applicable  Cardiac Monitoring: Continuous pulse oximetry interpreted by myself, 100% on room air.    Social Determinants of Health:  Diagnosis or treatment significantly limited by social determinants of health: former smoker   Reevaluation: After the interventions noted above, I reevaluated the patient and found that they have resolved  Co morbidities that complicate the patient evaluation  Past Medical History:  Diagnosis Date   Anemia    Asthma    Coronary artery disease    Dyspnea    with alergies   Hypertension    Hypothyroidism    Lichen sclerosus    vulva   Menopause    Polyp of colon 04/2006   benign       Dispostion: Disposition decision including need for hospitalization was considered, and patient discharged from emergency department.    Final Clinical Impression(s) / ED Diagnoses Final  diagnoses:  Epigastric pain  Gastritis without bleeding, unspecified chronicity, unspecified gastritis type  Hepatic steatosis        Elnor Jayson LABOR, DO 08/18/24 [id number]

## 2024-08-18 NOTE — ED Notes (Signed)
 RN provided AVS using Teachback Method. Patient verbalizes understanding of Discharge Instructions. Opportunity for Questioning and Answers were provided by RN. Patient Discharged from ED ambulatory to home with friend.

## 2024-08-18 NOTE — Discharge Instructions (Addendum)
 There are many causes of abdominal pain. Most pain is not serious and goes away, but some pain gets worse, changes, or will not go away. Please return to the emergency department or see your doctor right away if you (or your family member) experience any of the following:  1. Pain that gets worse or moves to just one spot.  2. Pain that gets worse if you cough or sneeze.  3. Pain with going over a bump in the road.  4. Pain that does not get better in 24 hours.  5. Inability to keep down liquids (vomiting)-especially if you are making less urine.  6. Fainting.  7. Blood in the vomit or stool.  8. High fever or shaking chills.  9. Swelling of the abdomen.  10. Any new or worsening problem.     Additional Instructions  No alcohol.  No caffeine, aspirin, or cigarettes.   Please follow up with gastroenterology in 2 weeks  Please return to the emergency department immediately for any new or concerning symptoms, or if you get worse.   Existen muchas causas de dolor abdominal. La mayora de los dolores no son graves y desaparecen, pero algunos [place], camb[place] o no desaparecen. Por favor, regrese a urgencias o consulte a su mdico de inmediato si usted (o Media pl[employer]) experimenta alguno de los siguientes sntomas: 1. Dolor que empeora o se localiza en un solo punto. 2. Dolor que empeora al toser o estornudar. 3. Dolor al pasar por un bache en la carretera. 4. Dolor que no mejora en 24 horas. 5. Incapacidad para retener lquidos (vmitos), especialmente si orina menos. 6. Desmayos. 7. Sangre en el vmito o las heces. 8. Fiebre alta o escalofros. 9. Hinchazn abdominal. 10. Cualquier problema nuevo o que empeore.  Instrucciones adicionales No consumir alcohol.  No consumir cafena, aspirina ni cigarrillos.  Por favor, consulte con gastroenterologa en 2 semanas.  Por favor, acuda de inmediato a urgencias si presenta sntomas nuevos o preocupantes, o si empeora.
